# Patient Record
Sex: Female | Born: 1985 | Race: White | Hispanic: No | Smoking: Never smoker
Health system: Southern US, Community
[De-identification: ages and names within clinical notes are randomized; demographics above are authoritative.]

## PROBLEM LIST (undated history)

## (undated) DIAGNOSIS — F32A Depression, unspecified: Secondary | ICD-10-CM

## (undated) DIAGNOSIS — E785 Hyperlipidemia, unspecified: Secondary | ICD-10-CM

## (undated) DIAGNOSIS — F419 Anxiety disorder, unspecified: Secondary | ICD-10-CM

## (undated) DIAGNOSIS — J45909 Unspecified asthma, uncomplicated: Secondary | ICD-10-CM

## (undated) HISTORY — DX: Unspecified asthma, uncomplicated: J45.909

## (undated) HISTORY — DX: Anxiety disorder, unspecified: F41.9

## (undated) HISTORY — DX: Depression, unspecified: F32.A

## (undated) HISTORY — DX: Hyperlipidemia, unspecified: E78.5

## (undated) HISTORY — PX: OPEN REPAIR PERIARTICULAR FRACTURE / DISLOCATION ELBOW: SUR901

## (undated) HISTORY — PX: TONSILLECTOMY: SUR1361

---

## 2011-11-18 DIAGNOSIS — IMO0002 Reserved for concepts with insufficient information to code with codable children: Secondary | ICD-10-CM | POA: Insufficient documentation

## 2016-07-19 DIAGNOSIS — J309 Allergic rhinitis, unspecified: Secondary | ICD-10-CM | POA: Insufficient documentation

## 2016-07-19 DIAGNOSIS — L74510 Primary focal hyperhidrosis, axilla: Secondary | ICD-10-CM | POA: Insufficient documentation

## 2016-07-19 DIAGNOSIS — Z6740 Type O blood, Rh positive: Secondary | ICD-10-CM | POA: Insufficient documentation

## 2016-07-19 DIAGNOSIS — Z3041 Encounter for surveillance of contraceptive pills: Secondary | ICD-10-CM | POA: Insufficient documentation

## 2016-08-23 DIAGNOSIS — Z9289 Personal history of other medical treatment: Secondary | ICD-10-CM | POA: Insufficient documentation

## 2016-08-24 DIAGNOSIS — M722 Plantar fascial fibromatosis: Secondary | ICD-10-CM | POA: Insufficient documentation

## 2016-08-24 DIAGNOSIS — Z0000 Encounter for general adult medical examination without abnormal findings: Secondary | ICD-10-CM | POA: Insufficient documentation

## 2016-09-07 DIAGNOSIS — M79672 Pain in left foot: Secondary | ICD-10-CM | POA: Insufficient documentation

## 2019-08-03 LAB — HM PAP SMEAR: HM Pap smear: NORMAL

## 2020-09-24 LAB — CBC AND DIFFERENTIAL
HCT: 35 — AB (ref 36–46)
Hemoglobin: 11.6 — AB (ref 12.0–16.0)
Platelets: 421 — AB (ref 150–399)
WBC: 9.3

## 2020-09-24 LAB — CBC: RBC: 4.4 (ref 3.87–5.11)

## 2020-11-20 ENCOUNTER — Encounter: Payer: Self-pay | Admitting: Medical-Surgical

## 2020-11-25 NOTE — Telephone Encounter (Signed)
Unfortunately we can't email forms like that because we would need to see it filled out and a witness signature has to be on the form as well. She can ask her current office for a medical record release form there to fill out, and we can give her our fax number to fax it from there. AM

## 2020-12-02 ENCOUNTER — Encounter: Payer: Self-pay | Admitting: Medical-Surgical

## 2020-12-02 ENCOUNTER — Ambulatory Visit: Payer: 59 | Admitting: Medical-Surgical

## 2020-12-02 ENCOUNTER — Other Ambulatory Visit: Payer: Self-pay

## 2020-12-02 VITALS — BP 107/70 | HR 86 | Temp 98.2°F | Ht 67.5 in | Wt 284.5 lb

## 2020-12-02 DIAGNOSIS — Z789 Other specified health status: Secondary | ICD-10-CM

## 2020-12-02 DIAGNOSIS — J302 Other seasonal allergic rhinitis: Secondary | ICD-10-CM

## 2020-12-02 DIAGNOSIS — J45909 Unspecified asthma, uncomplicated: Secondary | ICD-10-CM

## 2020-12-02 DIAGNOSIS — K219 Gastro-esophageal reflux disease without esophagitis: Secondary | ICD-10-CM

## 2020-12-02 DIAGNOSIS — Z114 Encounter for screening for human immunodeficiency virus [HIV]: Secondary | ICD-10-CM

## 2020-12-02 DIAGNOSIS — F410 Panic disorder [episodic paroxysmal anxiety] without agoraphobia: Secondary | ICD-10-CM

## 2020-12-02 DIAGNOSIS — Z87898 Personal history of other specified conditions: Secondary | ICD-10-CM

## 2020-12-02 DIAGNOSIS — Z8619 Personal history of other infectious and parasitic diseases: Secondary | ICD-10-CM

## 2020-12-02 DIAGNOSIS — Z7689 Persons encountering health services in other specified circumstances: Secondary | ICD-10-CM

## 2020-12-02 DIAGNOSIS — D508 Other iron deficiency anemias: Secondary | ICD-10-CM

## 2020-12-02 DIAGNOSIS — J329 Chronic sinusitis, unspecified: Secondary | ICD-10-CM | POA: Diagnosis not present

## 2020-12-02 DIAGNOSIS — Z1159 Encounter for screening for other viral diseases: Secondary | ICD-10-CM

## 2020-12-02 DIAGNOSIS — E559 Vitamin D deficiency, unspecified: Secondary | ICD-10-CM

## 2020-12-02 DIAGNOSIS — E782 Mixed hyperlipidemia: Secondary | ICD-10-CM

## 2020-12-02 MED ORDER — OZEMPIC (1 MG/DOSE) 4 MG/3ML ~~LOC~~ SOPN: 1.0000 mg | PEN_INJECTOR | SUBCUTANEOUS | 1 refills | Status: DC

## 2020-12-02 NOTE — Progress Notes (Addendum)
New Patient Office Visit  Subjective:  Patient ID: Shawna Barnett, female    DOB: 1985/12/11  Age: 35 y.o. MRN: [id number]  CC:  Chief Complaint  Patient presents with  . Establish Care    HPI Shawna Barnett presents to establish care.   Asthma/seasonal allergies-Long history of severe allergies.  She is followed by [employer] nose and throat as well as an allergist.  She is currently undergoing immunotherapy and they are switching her vials to accommodate her move from [place] to [place].  She takes Singulair, Claritin, and Allegra daily.  Has an EpiPen on hand for use due to several severe allergies/risk of anaphylaxis.  Chronic sinusitis-recently completed 4 weeks of antibiotics due to a severe sinus infection that was resistant to the original treatment.  Followed by [facility].  Hyperlipidemia-she is trying to avoid starting a statin medication.  She realizes her cholesterol is likely partly genetic.  She does have a bit of a finicky diet preference.  Avoids eggs as well as red meat.  And deficiency anemia-notes that she is mildly anemic related to iron deficiency.  Taking an oral iron supplement daily.  Vitamin D deficiency-taking oral vitamin D supplements daily.  Her last check was normal.  Birth control-taking Junel, tolerating well without side effects.  She has been on this for about 15 years.  Did have a recent menstrual cycle that lasted for 14 days but feels this may be related to stress as well as long-term antibiotics.  Planning to attempt pregnancy once she has lost more weight.  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  Has Valtrex to use as needed but often topical use of Abreva will take care of it if started early enough.  GERD-taking omeprazole 20 mg daily as prescribed, tolerating well without side effects.  Symptoms well managed. Anxiety/panic attacks-used to be treated with Lexapro but was able to  successfully wean off this.  She does do counseling regularly which has been very helpful to develop coping skills.  Has Xanax 0.25 mg as needed ordered and takes only half a pill for severe panic attacks.  Notes that she may have had a total of 20 doses in the last 4 years.  Morbid obesity-has been taking Ozempic 0.5 mg weekly.  Lost approximately 30 pounds in the first 2 months on that but since then, has not lost any more.  Was previously prediabetic but since starting Ozempic, A1c came back at 5.3%.  She does walk for exercise but has pain in her legs and feet so can be limited at times.  Would like to increase Ozempic to 1 mg weekly if possible.  History reviewed. No pertinent past medical history.  History reviewed. No pertinent surgical history.  History reviewed. No pertinent family history.  Social History   Socioeconomic History  . Marital status: Married    Spouse name: Not on file  . Number of children: Not on file  . Years of education: Not on file  . Highest education level: Not on file  Occupational History  . Not on file  Tobacco Use  . Smoking status: Never Smoker  . Smokeless tobacco: Never Used  Substance and Sexual Activity  . Alcohol use: Yes    Comment: RARELY  . Drug use: Never  . Sexual activity: Yes    Partners: Male    Birth control/protection: Pill  Other Topics Concern  . Not on file  Social History Narrative  .  Not on file   Social Determinants of Health   Financial Resource Strain: Not on file  Food Insecurity: Not on file  Transportation Needs: Not on file  Physical Activity: Not on file  Stress: Not on file  Social Connections: Not on file  Intimate Partner Violence: Not on file    ROS Review of Systems  Constitutional: Negative for chills, fatigue, fever and unexpected weight change.  HENT: Positive for congestion and sinus pressure. Negative for sore throat.   Respiratory: Positive for shortness of breath (on exertion). Negative for  cough, chest tightness and wheezing.   Cardiovascular: Negative for chest pain, palpitations and leg swelling.  Gastrointestinal: Negative for abdominal pain, constipation, diarrhea, nausea and vomiting.  Endocrine: Negative for cold intolerance and heat intolerance.  Genitourinary: Negative for dysuria, frequency, urgency, vaginal bleeding and vaginal discharge.  Skin: Negative for rash and wound.  Neurological: Negative for dizziness, light-headedness and headaches.  Hematological: Does not bruise/bleed easily.  Psychiatric/Behavioral: Negative for dysphoric mood, self-injury, sleep disturbance and suicidal ideas. The patient is nervous/anxious.     Objective:   Today's Vitals: BP 107/70   Pulse 86   Temp 98.2 F (36.8 C)   Ht 5' 7.5" (1.715 m)   Wt 284 lb 8 oz (129 kg)   LMP 11/18/2020   SpO2 98%   BMI 43.90 kg/m   Physical Exam Vitals reviewed.  Constitutional:      General: She is not in acute distress.    Appearance: Normal appearance. She is obese. She is not ill-appearing.  HENT:     Head: Normocephalic and atraumatic.  Cardiovascular:     Rate and Rhythm: Normal rate and regular rhythm.     Pulses: Normal pulses.     Heart sounds: Normal heart sounds. No murmur heard. No friction rub. No gallop.   Pulmonary:     Effort: Pulmonary effort is normal. No respiratory distress.     Breath sounds: Normal breath sounds. No wheezing.  Skin:    General: Skin is warm and dry.  Neurological:     Mental Status: She is alert and oriented to person, place, and time.  Psychiatric:        Mood and Affect: Mood normal.        Behavior: Behavior normal.        Thought Content: Thought content normal.        Judgment: Judgment normal.     Assessment & Plan:   1. Encounter to establish care Reviewed available information and discussed healthcare concerns with patient.  She is very active in her health care which is quite refreshing.  Will be due for follow-up on lab work in  about 3 months.  2. Uncomplicated asthma, unspecified asthma severity, unspecified whether persistent Continue albuterol and Qvar inhalers as prescribed.  Continue treatments for seasonal allergies to prevent exacerbation of asthma.  3. Seasonal allergies Continue Singulair, Claritin, and Allegra as prescribed.  Further management by her allergist.  4. Chronic sinusitis, unspecified location Managed by ENT.  5. History of prediabetes Last A1c of 5.3% looks amazing.  Continue weight loss efforts and dietary modifications.  6. Morbid obesity (HCC) Increasing Ozempic to 1 mg weekly.  Continue exercise and dietary modifications.  7. Mixed hyperlipidemia Continue to monitor diet for areas of improvement.  If lipids are still elevated next check, discuss options and alternatives for treating hyperlipidemia outside of statins.  8. Iron deficiency anemia secondary to inadequate dietary iron intake Continue oral iron supplements  9. Vitamin D deficiency Continue vitamin D supplements.  10. Uses birth control Continue Junel as prescribed  11. Hx of cold sores Continue Abreva and Valtrex as needed  12. Gastroesophageal reflux disease without esophagitis Continue omeprazole 20 mg daily as prescribed.  13. Panic attacks Okay to continue Xanax for panic attacks with extreme sparing use.  Continue counseling.  Outpatient Encounter Medications as of 12/02/2020  Medication Sig  . albuterol (VENTOLIN HFA) 108 (90 Base) MCG/ACT inhaler Inhale 2 puffs into the lungs every 4 (four) hours as needed.  . ALPRAZolam (XANAX) 0.25 MG tablet Take 0.25 mg by mouth daily as needed.  . beclomethasone (QVAR) 40 MCG/ACT inhaler Inhale into the lungs 2 (two) times daily as needed.  . cholecalciferol (VITAMIN D3) 25 MCG (1000 UNIT) tablet Take 1,000 Units by mouth daily.  Marland Kitchen EPINEPHrine 0.3 mg/0.3 mL IJ SOAJ injection Inject 0.3 mg into the muscle as needed for anaphylaxis.  . Ferrous Sulfate (IRON) 28 MG  TABS Take by mouth daily.  . fexofenadine (ALLEGRA) 180 MG tablet Take 180 mg by mouth daily.  Marland Kitchen ipratropium (ATROVENT) 0.06 % nasal spray Place 2 sprays into both nostrils 2 (two) times daily.  Colleen Can FE 1/20 1-20 MG-MCG tablet Take 1 tablet by mouth daily.  Marland Kitchen loratadine (CLARITIN) 10 MG tablet Take 10 mg by mouth daily.  . montelukast (SINGULAIR) 10 MG tablet Take 1 tablet by mouth daily.  Marland Kitchen omeprazole (PRILOSEC) 20 MG capsule Take 20 mg by mouth daily.  . Probiotic Product (PROBIOTIC PO) Take by mouth daily.  . Semaglutide, 1 MG/DOSE, (OZEMPIC, 1 MG/DOSE,) 4 MG/3ML SOPN Inject 1 mg into the skin once a week.  . valACYclovir (VALTREX) 1000 MG tablet Take 1,000 mg by mouth daily as needed. For cold sores  . vitamin B-12 (CYANOCOBALAMIN) 1000 MCG tablet Take 1,000 mcg by mouth daily.  . [DISCONTINUED] OZEMPIC, 0.25 OR 0.5 MG/DOSE, 2 MG/1.5ML SOPN 0.5 mg once a week.   No facility-administered encounter medications on file as of 12/02/2020.   Follow-up: Return in about 3 months (around 03/04/2021) for chronic disease follow up.   Thayer Ohm, DNP, APRN, FNP-BC [facility] and Sports Medicine

## 2020-12-15 ENCOUNTER — Encounter: Payer: Self-pay | Admitting: Medical-Surgical

## 2020-12-17 ENCOUNTER — Encounter: Payer: Self-pay | Admitting: Medical-Surgical

## 2020-12-25 ENCOUNTER — Encounter: Payer: Self-pay | Admitting: Medical-Surgical

## 2020-12-25 DIAGNOSIS — R7303 Prediabetes: Secondary | ICD-10-CM | POA: Insufficient documentation

## 2020-12-25 DIAGNOSIS — K219 Gastro-esophageal reflux disease without esophagitis: Secondary | ICD-10-CM | POA: Insufficient documentation

## 2020-12-25 DIAGNOSIS — R7989 Other specified abnormal findings of blood chemistry: Secondary | ICD-10-CM | POA: Insufficient documentation

## 2020-12-25 DIAGNOSIS — E785 Hyperlipidemia, unspecified: Secondary | ICD-10-CM | POA: Insufficient documentation

## 2020-12-25 DIAGNOSIS — Z862 Personal history of diseases of the blood and blood-forming organs and certain disorders involving the immune mechanism: Secondary | ICD-10-CM | POA: Insufficient documentation

## 2020-12-25 DIAGNOSIS — J4530 Mild persistent asthma, uncomplicated: Secondary | ICD-10-CM | POA: Insufficient documentation

## 2020-12-25 DIAGNOSIS — F419 Anxiety disorder, unspecified: Secondary | ICD-10-CM | POA: Insufficient documentation

## 2020-12-25 DIAGNOSIS — E8881 Metabolic syndrome: Secondary | ICD-10-CM | POA: Insufficient documentation

## 2021-01-06 ENCOUNTER — Encounter: Payer: Self-pay | Admitting: Medical-Surgical

## 2021-01-06 ENCOUNTER — Telehealth (INDEPENDENT_AMBULATORY_CARE_PROVIDER_SITE_OTHER): Payer: 59 | Admitting: Medical-Surgical

## 2021-01-06 DIAGNOSIS — F419 Anxiety disorder, unspecified: Secondary | ICD-10-CM | POA: Diagnosis not present

## 2021-01-06 MED ORDER — ESCITALOPRAM OXALATE 5 MG PO TABS: 5.0000 mg | ORAL_TABLET | Freq: Every day | ORAL | 1 refills | Status: DC

## 2021-01-06 NOTE — Progress Notes (Signed)
Virtual Visit via Video Note  I connected with Shawna Barnett on 01/06/21 at  3:40 PM EDT by a video enabled telemedicine application and verified that I am speaking with the correct person using two identifiers.   I discussed the limitations of evaluation and management by telemedicine and the availability of in person appointments. The patient expressed understanding and agreed to proceed.  Patient location: home Provider locations: office  Subjective:    CC: Mood follow-up  HPI: Pleasant 35 year old female presenting today via MyChart video visit for mood follow-up.  Notes that she started Lexapro 5 mg daily approximately 4 weeks ago.  She is doing very well on this medication and has had no side effects.  Notes that her job and a recent move had caused some significant anxiety which prompted her to restart the medication.  By day 2, she was feeling normal again.  Previously treated with 10 mg dose but felt this left her emotions feeling to numb.  She is currently doing counseling every 2 weeks which is helpful.  Has been able to resolve some of her stress, especially now that she is completed her move.  Since starting Lexapro, has not had to use any doses of Xanax.  Denies SI/HI.  Of note, she has been using Ozempic 1 mg subcu weekly and doing well on the medication.  She has no side effects to mention and reports that she is losing approximately 1 pound per week since starting the medication.   Past medical history, Surgical history, Family history not pertinant except as noted below, Social history, Allergies, and medications have been entered into the medical record, reviewed, and corrections made.   Review of Systems: See HPI for pertinent positives and negatives.   Objective:    General: Speaking clearly in complete sentences without any shortness of breath.  Alert and oriented x3.  Normal judgment. No apparent acute distress.  Impression and Recommendations:    1.  Anxiety Continue Lexapro 5 mg daily.  New prescription called in to the pharmacy per patient request.  Continue counseling every 2 weeks as instructed.  2. Morbid obesity (HCC) Continue Ozempic 1 mg weekly.  Continue dietary and lifestyle modifications for weight loss.  I discussed the assessment and treatment plan with the patient. The patient was provided an opportunity to ask questions and all were answered. The patient agreed with the plan and demonstrated an understanding of the instructions.   The patient was advised to call back or seek an in-person evaluation if the symptoms worsen or if the condition fails to improve as anticipated.  20 minutes of non-face-to-face time was provided during this encounter.  Return in about 6 months (around 07/08/2021) for mood follow up.  Thayer Ohm, DNP, APRN, FNP-BC [facility] and Sports Medicine

## 2021-01-13 ENCOUNTER — Encounter: Payer: Self-pay | Admitting: Medical-Surgical

## 2021-03-04 ENCOUNTER — Ambulatory Visit: Payer: 59 | Admitting: Medical-Surgical

## 2021-03-06 ENCOUNTER — Encounter: Payer: Self-pay | Admitting: Medical-Surgical

## 2021-03-06 ENCOUNTER — Telehealth: Payer: Self-pay | Admitting: Medical-Surgical

## 2021-03-06 ENCOUNTER — Other Ambulatory Visit: Payer: Self-pay

## 2021-03-06 ENCOUNTER — Ambulatory Visit: Payer: 59 | Admitting: Medical-Surgical

## 2021-03-06 VITALS — BP 106/72 | HR 83 | Resp 20 | Wt 270.0 lb

## 2021-03-06 DIAGNOSIS — J302 Other seasonal allergic rhinitis: Secondary | ICD-10-CM | POA: Diagnosis not present

## 2021-03-06 DIAGNOSIS — D508 Other iron deficiency anemias: Secondary | ICD-10-CM

## 2021-03-06 DIAGNOSIS — E8881 Metabolic syndrome: Secondary | ICD-10-CM

## 2021-03-06 DIAGNOSIS — E782 Mixed hyperlipidemia: Secondary | ICD-10-CM

## 2021-03-06 DIAGNOSIS — F419 Anxiety disorder, unspecified: Secondary | ICD-10-CM | POA: Diagnosis not present

## 2021-03-06 DIAGNOSIS — E538 Deficiency of other specified B group vitamins: Secondary | ICD-10-CM

## 2021-03-06 DIAGNOSIS — E559 Vitamin D deficiency, unspecified: Secondary | ICD-10-CM

## 2021-03-06 DIAGNOSIS — R7303 Prediabetes: Secondary | ICD-10-CM

## 2021-03-06 DIAGNOSIS — J45909 Unspecified asthma, uncomplicated: Secondary | ICD-10-CM

## 2021-03-06 DIAGNOSIS — F410 Panic disorder [episodic paroxysmal anxiety] without agoraphobia: Secondary | ICD-10-CM

## 2021-03-06 NOTE — Telephone Encounter (Signed)
A1c added. Req #: [id number]

## 2021-03-06 NOTE — Telephone Encounter (Signed)
Please add hemoglobin A1c to blood collected today.  Thayer Ohm, DNP, APRN, FNP-BC [facility] and Sports Medicine

## 2021-03-06 NOTE — Progress Notes (Signed)
  HPI with pertinent ROS:   CC: general follow up  HPI: Very pleasant 35 year old female presenting today for general follow-up on:  Anxiety/mood-has been doing very well taking Lexapro 5 mg daily.  Notes that she does not have the emotional numbing as she did on the higher dose.  Feels that her symptoms are very well controlled.  Has not had to use Xanax since starting the low-dose Lexapro.  Asthma-she is using her albuterol inhaler as well as her Qvar twice daily regularly over the last 2 weeks and seems to be managing well on that regimen.  Also taking Claritin, Allegra, and Singulair.  She does have significant allergies and is currently receiving allergy shots.  Being followed by [facility].  Metabolic syndrome-has been using Ozempic 1 mg weekly, tolerating well without side effects.  Over the last 3 months, has had approximately 13 pound weight loss.  Notes that she is doing very well and quite happy with her treatment.  I reviewed the past medical history, family history, social history, surgical history, and allergies today and no changes were needed.  Please see the problem list section below in epic for further details.   Physical exam:   General: Well Developed, well nourished, and in no acute distress.  Neuro: Alert and oriented x3.  HEENT: Normocephalic, atraumatic. Skin: Warm and dry. Cardiac: Regular rate and rhythm, no murmurs rubs or gallops, no lower extremity edema.  Respiratory: Clear to auscultation bilaterally. Not using accessory muscles, speaking in full sentences.  Impression and Recommendations:    1. Anxiety Continue Lexapro 5mg  daily as prescribed.   2. Morbid obesity (HCC) Continue Ozempic 1mg  daily.  Continue diet and exercise efforts.  Suggest increasing resistance/strength training to help build muscle.  3. Seasonal allergies 4. Uncomplicated asthma, unspecified asthma severity, unspecified whether persistent Continue inhalers and oral agents as  prescribed.  Follow-up with [facility] as instructed.  5. Mixed hyperlipidemia Checking lipid panel today. - Lipid panel  6. Vitamin D deficiency Checking vitamin D today. - VITAMIN D 25 Hydroxy (Vit-D Deficiency, Fractures)  7. Iron deficiency anemia secondary to inadequate dietary iron intake Checking CBC with differential and iron panel today. - CBC with Differential/Platelet - Fe+TIBC+Fer  8. Panic attacks Doing quite well with the Lexapro 5 mg daily.  No change in management at this point.  9. Prediabetes Continue Ozempic as prescribed.  Checking hemoglobin A1c.  10. Metabolic syndrome Checking CMP and TSH. - COMPLETE METABOLIC PANEL WITH GFR - TSH  11. B12 deficiency Checking vitamin B12. - Vitamin B12  Return in about 6 months (around 09/06/2021) for chronic disease follow up. ___________________________________________ , DNP, APRN, FNP-BC Primary Care and Sports Medicine [facility]

## 2021-03-11 LAB — COMPLETE METABOLIC PANEL WITH GFR
AG Ratio: 1.4 (calc) (ref 1.0–2.5)
ALT: 8 U/L (ref 6–29)
AST: 11 U/L (ref 10–30)
Albumin: 4.2 g/dL (ref 3.6–5.1)
Alkaline phosphatase (APISO): 74 U/L (ref 31–125)
BUN: 13 mg/dL (ref 7–25)
CO2: 26 mmol/L (ref 20–32)
Calcium: 9.4 mg/dL (ref 8.6–10.2)
Chloride: 104 mmol/L (ref 98–110)
Creat: 0.97 mg/dL (ref 0.50–0.97)
Globulin: 3 g/dL (calc) (ref 1.9–3.7)
Glucose, Bld: 93 mg/dL (ref 65–99)
Potassium: 4.9 mmol/L (ref 3.5–5.3)
Sodium: 139 mmol/L (ref 135–146)
Total Bilirubin: 0.3 mg/dL (ref 0.2–1.2)
Total Protein: 7.2 g/dL (ref 6.1–8.1)
eGFR: 79 mL/min/{1.73_m2} (ref >=60)

## 2021-03-11 LAB — IRON,TIBC AND FERRITIN PANEL
%SAT: 11 % (calc) — ABNORMAL LOW (ref 16–45)
Ferritin: 13 ng/mL — ABNORMAL LOW (ref 16–154)
Iron: 44 ug/dL (ref 40–190)
TIBC: 398 mcg/dL (calc) (ref 250–450)

## 2021-03-11 LAB — CBC WITH DIFFERENTIAL/PLATELET
Absolute Monocytes: 492 cells/uL (ref 200–950)
Basophils Absolute: 37 cells/uL (ref 0–200)
Basophils Relative: 0.3 %
Eosinophils Absolute: 135 cells/uL (ref 15–500)
Eosinophils Relative: 1.1 %
HCT: 42.5 % (ref 35.0–45.0)
Hemoglobin: 13.7 g/dL (ref 11.7–15.5)
Lymphs Abs: 3014 cells/uL (ref 850–3900)
MCH: 27.3 pg (ref 27.0–33.0)
MCHC: 32.2 g/dL (ref 32.0–36.0)
MCV: 84.7 fL (ref 80.0–100.0)
MPV: 11.9 fL (ref 7.5–12.5)
Monocytes Relative: 4 %
Neutro Abs: 8622 cells/uL — ABNORMAL HIGH (ref 1500–7800)
Neutrophils Relative %: 70.1 %
Platelets: 394 10*3/uL (ref 140–400)
RBC: 5.02 10*6/uL (ref 3.80–5.10)
RDW: 13.5 % (ref 11.0–15.0)
Total Lymphocyte: 24.5 %
WBC: 12.3 10*3/uL — ABNORMAL HIGH (ref 3.8–10.8)

## 2021-03-11 LAB — TEST AUTHORIZATION

## 2021-03-11 LAB — HEMOGLOBIN A1C W/OUT EAG: Hgb A1c MFr Bld: 5.4 % of total Hgb (ref <5.7)

## 2021-03-11 LAB — LIPID PANEL
Cholesterol: 229 mg/dL — ABNORMAL HIGH (ref <200)
HDL: 52 mg/dL (ref >=50)
LDL Cholesterol (Calc): 145 mg/dL (calc) — ABNORMAL HIGH
Non-HDL Cholesterol (Calc): 177 mg/dL (calc) — ABNORMAL HIGH (ref <130)
Total CHOL/HDL Ratio: 4.4 (calc) (ref <5.0)
Triglycerides: 180 mg/dL — ABNORMAL HIGH (ref <150)

## 2021-03-11 LAB — TSH: TSH: 0.55 mIU/L

## 2021-03-11 LAB — VITAMIN D 25 HYDROXY (VIT D DEFICIENCY, FRACTURES): Vit D, 25-Hydroxy: 64 ng/mL (ref 30–100)

## 2021-03-11 LAB — VITAMIN B12: Vitamin B-12: 453 pg/mL (ref 200–1100)

## 2021-05-28 ENCOUNTER — Encounter: Payer: Self-pay | Admitting: Medical-Surgical

## 2021-06-01 MED ORDER — SEMAGLUTIDE-WEIGHT MANAGEMENT 1.7 MG/0.75ML ~~LOC~~ SOAJ: 1.7000 mg | SUBCUTANEOUS | 0 refills | Status: DC

## 2021-06-26 ENCOUNTER — Other Ambulatory Visit: Payer: Self-pay | Admitting: Medical-Surgical

## 2021-06-30 ENCOUNTER — Other Ambulatory Visit: Payer: Self-pay | Admitting: Medical-Surgical

## 2021-07-02 ENCOUNTER — Encounter: Payer: Self-pay | Admitting: Medical-Surgical

## 2021-07-03 MED ORDER — QVAR REDIHALER 40 MCG/ACT IN AERB: 1.0000 | INHALATION_SPRAY | Freq: Two times a day (BID) | RESPIRATORY_TRACT | 5 refills | Status: DC | PRN

## 2021-07-24 ENCOUNTER — Other Ambulatory Visit: Payer: Self-pay | Admitting: Medical-Surgical

## 2021-08-04 ENCOUNTER — Ambulatory Visit (INDEPENDENT_AMBULATORY_CARE_PROVIDER_SITE_OTHER): Payer: 59

## 2021-08-04 ENCOUNTER — Other Ambulatory Visit: Payer: Self-pay | Admitting: General Practice

## 2021-08-04 ENCOUNTER — Other Ambulatory Visit: Payer: Self-pay

## 2021-08-04 DIAGNOSIS — R52 Pain, unspecified: Secondary | ICD-10-CM

## 2021-08-27 ENCOUNTER — Other Ambulatory Visit: Payer: Self-pay | Admitting: Medical-Surgical

## 2021-09-03 ENCOUNTER — Other Ambulatory Visit: Payer: Self-pay | Admitting: Medical-Surgical

## 2021-09-03 NOTE — Telephone Encounter (Signed)
Please see pharmacy comments with alternatives.

## 2021-09-22 ENCOUNTER — Other Ambulatory Visit: Payer: Self-pay | Admitting: Medical-Surgical

## 2021-09-27 NOTE — Progress Notes (Signed)
HPI with pertinent ROS:   CC: 6 month follow up  HPI: Pleasant 36 year old female presenting today for the following:  Asthma-doing well overall.  She uses budesonide inhaler and albuterol for second the morning.  Also uses albuterol for exercise.  Feels this keeps her symptoms well controlled.  GERD-taking omeprazole 20 mg daily and notes a rebound of symptoms if she forgets her dose.  Occasionally uses over-the-counter famotidine when reflux is bad.  Has not tried coming off of omeprazole but is willing to try famotidine twice daily and a slow taper of her PPI to see if this is tolerable.  Anxiety-taking Lexapro 5 mg daily, tolerating well without side effects.  Feels this is working well to keep her mood stable.  She does have significant work stress but sees a Veterinary surgeon around 2 times monthly.  Occasionally, she will see them more frequently.  Does have Xanax to use as needed however she has not had to use this in approximately 6 months.  Denies SI/HI.  Prediabetes-using Wegovy 1.7 mg weekly.  Has had a decrease in her appetite overall and is making healthier choices.  HLD-not currently on medication.  Does follow a low-fat diet.  Does not eat meat and is quite picky about her eggs.  Does like cheese but has not been eating much of this lately.  Weight management-using Wegovy as above.  Has lost approximately 31 pounds since May 2022.  Exercising 5 days a week doing treadmill sessions for 45 minutes to 1 hour each time.  Iron deficiency-taking oral iron supplements as directed.  Seasonal allergies-taking Allegra 180 mg and singular 10 mg daily as prescribed, tolerating well without side effects.  Has Atrovent nasal spray to use which does help with her symptoms.  I reviewed the past medical history, family history, social history, surgical history, and allergies today and no changes were needed.  Please see the problem list section below in epic for further details.   Physical exam:    General: Well Developed, well nourished, and in no acute distress.  Neuro: Alert and oriented x3, extra-ocular muscles intact, sensation grossly intact.  HEENT: Normocephalic, atraumatic, pupils equal round reactive to light, neck supple, no masses, no lymphadenopathy, thyroid nonpalpable.  Skin: Warm and dry, no rashes. Cardiac: Regular rate and rhythm, no murmurs rubs or gallops, no lower extremity edema.  Respiratory: Clear to auscultation bilaterally. Not using accessory muscles, speaking in full sentences.  Impression and Recommendations:    1. Mild persistent asthma without complication Stable.  Continue albuterol inhaler as needed and budesonide as prescribed twice daily.  2. Gastroesophageal reflux disease, unspecified whether esophagitis present Discussed using famotidine 20 mg twice daily and working on titrating off of her PPI.  If symptoms do recur or worsen with the titration, okay to continue omeprazole 20 mg daily.  3. Anxiety Stable.  Continue Lexapro 5 mg daily.  Only use Xanax for severe anxiety.  Continue counseling.  4. Prediabetes Checking labs as below. - COMPLETE METABOLIC PANEL WITH GFR - Hemoglobin A1c  5. Mixed hyperlipidemia Checking lipid panel. - Lipid panel  6. Iron deficiency anemia secondary to inadequate dietary iron intake Checking CBC and iron panel - CBC - Fe+TIBC+Fer  7. Encounter for weight management 31 pounds lost since May 2022.  Continue Wegovy 1.7 mg weekly.  Sending in as a 1-month supply to see if this will be covered by insurance.  8. Seasonal allergies Continue Allegra, Singulair, and Atrovent.  9. Encounter for screening for HIV Discussed  screening recommendations.  Patient is agreeable so adding to blood work today. - HIV Antibody (routine testing w rflx)  10. Need for hepatitis C screening test Discussed screening recommendations.  Patient is agreeable so adding to blood work today. - Hepatitis C Antibody  Return in  about 6 months (around 03/28/2022) for chronic disease follow up. ___________________________________________ Thayer Ohm, DNP, APRN, FNP-BC Primary Care and Sports Medicine [facility]

## 2021-09-28 ENCOUNTER — Ambulatory Visit: Payer: 59 | Admitting: Medical-Surgical

## 2021-09-28 ENCOUNTER — Encounter: Payer: Self-pay | Admitting: Medical-Surgical

## 2021-09-28 ENCOUNTER — Other Ambulatory Visit: Payer: Self-pay

## 2021-09-28 VITALS — BP 98/66 | HR 81 | Resp 20 | Ht 67.5 in | Wt 253.0 lb

## 2021-09-28 DIAGNOSIS — J4530 Mild persistent asthma, uncomplicated: Secondary | ICD-10-CM | POA: Diagnosis not present

## 2021-09-28 DIAGNOSIS — Z7689 Persons encountering health services in other specified circumstances: Secondary | ICD-10-CM

## 2021-09-28 DIAGNOSIS — F419 Anxiety disorder, unspecified: Secondary | ICD-10-CM

## 2021-09-28 DIAGNOSIS — K219 Gastro-esophageal reflux disease without esophagitis: Secondary | ICD-10-CM

## 2021-09-28 DIAGNOSIS — R7303 Prediabetes: Secondary | ICD-10-CM | POA: Diagnosis not present

## 2021-09-28 DIAGNOSIS — D508 Other iron deficiency anemias: Secondary | ICD-10-CM

## 2021-09-28 DIAGNOSIS — Z1159 Encounter for screening for other viral diseases: Secondary | ICD-10-CM

## 2021-09-28 DIAGNOSIS — E782 Mixed hyperlipidemia: Secondary | ICD-10-CM

## 2021-09-28 DIAGNOSIS — Z114 Encounter for screening for human immunodeficiency virus [HIV]: Secondary | ICD-10-CM

## 2021-09-28 DIAGNOSIS — J302 Other seasonal allergic rhinitis: Secondary | ICD-10-CM

## 2021-09-28 MED ORDER — WEGOVY 1.7 MG/0.75ML ~~LOC~~ SOAJ: 1.7000 mg | SUBCUTANEOUS | 1 refills | Status: DC

## 2021-10-07 LAB — LIPID PANEL
Cholesterol: 241 mg/dL — ABNORMAL HIGH (ref <200)
HDL: 50 mg/dL (ref >=50)
LDL Cholesterol (Calc): 169 mg/dL (calc) — ABNORMAL HIGH
Non-HDL Cholesterol (Calc): 191 mg/dL (calc) — ABNORMAL HIGH (ref <130)
Total CHOL/HDL Ratio: 4.8 (calc) (ref <5.0)
Triglycerides: 98 mg/dL (ref <150)

## 2021-10-07 LAB — CBC
HCT: 43.8 % (ref 35.0–45.0)
Hemoglobin: 14.1 g/dL (ref 11.7–15.5)
MCH: 27.9 pg (ref 27.0–33.0)
MCHC: 32.2 g/dL (ref 32.0–36.0)
MCV: 86.7 fL (ref 80.0–100.0)
MPV: 11.4 fL (ref 7.5–12.5)
Platelets: 388 10*3/uL (ref 140–400)
RBC: 5.05 10*6/uL (ref 3.80–5.10)
RDW: 12.9 % (ref 11.0–15.0)
WBC: 7.6 10*3/uL (ref 3.8–10.8)

## 2021-10-07 LAB — COMPLETE METABOLIC PANEL WITH GFR
AG Ratio: 1.3 (calc) (ref 1.0–2.5)
ALT: 9 U/L (ref 6–29)
AST: 12 U/L (ref 10–30)
Albumin: 4 g/dL (ref 3.6–5.1)
Alkaline phosphatase (APISO): 81 U/L (ref 31–125)
BUN: 11 mg/dL (ref 7–25)
CO2: 27 mmol/L (ref 20–32)
Calcium: 9.6 mg/dL (ref 8.6–10.2)
Chloride: 102 mmol/L (ref 98–110)
Creat: 0.91 mg/dL (ref 0.50–0.97)
Globulin: 3.1 g/dL (calc) (ref 1.9–3.7)
Glucose, Bld: 86 mg/dL (ref 65–99)
Potassium: 4.6 mmol/L (ref 3.5–5.3)
Sodium: 137 mmol/L (ref 135–146)
Total Bilirubin: 0.4 mg/dL (ref 0.2–1.2)
Total Protein: 7.1 g/dL (ref 6.1–8.1)
eGFR: 85 mL/min/{1.73_m2} (ref >=60)

## 2021-10-07 LAB — HIV ANTIBODY (ROUTINE TESTING W REFLEX): HIV 1&2 Ab, 4th Generation: NONREACTIVE

## 2021-10-07 LAB — IRON,TIBC AND FERRITIN PANEL
%SAT: 19 % (calc) (ref 16–45)
Ferritin: 17 ng/mL (ref 16–154)
Iron: 65 ug/dL (ref 40–190)
TIBC: 343 mcg/dL (calc) (ref 250–450)

## 2021-10-07 LAB — HEMOGLOBIN A1C
Hgb A1c MFr Bld: 5.4 % of total Hgb (ref <5.7)
Mean Plasma Glucose: 108 mg/dL
eAG (mmol/L): 6 mmol/L

## 2021-10-07 LAB — HEPATITIS C ANTIBODY
Hepatitis C Ab: NONREACTIVE
SIGNAL TO CUT-OFF: 0.03 (ref <1.00)

## 2021-10-10 ENCOUNTER — Encounter: Payer: Self-pay | Admitting: Medical-Surgical

## 2021-10-13 ENCOUNTER — Encounter: Payer: Self-pay | Admitting: Medical-Surgical

## 2021-10-13 ENCOUNTER — Other Ambulatory Visit: Payer: Self-pay

## 2021-10-13 ENCOUNTER — Ambulatory Visit: Payer: 59 | Admitting: Sports Medicine

## 2021-10-13 DIAGNOSIS — M47816 Spondylosis without myelopathy or radiculopathy, lumbar region: Secondary | ICD-10-CM | POA: Diagnosis not present

## 2021-10-13 MED ORDER — MELOXICAM 15 MG PO TABS: ORAL_TABLET | ORAL | 3 refills | Status: DC

## 2021-10-13 MED ORDER — WEGOVY 2.4 MG/0.75ML ~~LOC~~ SOAJ: 2.4000 mg | SUBCUTANEOUS | 1 refills | Status: DC

## 2021-10-13 NOTE — Assessment & Plan Note (Signed)
Shawna Barnett is a very pleasant 36 year old female, she is obese but has lost 60 pounds on Wegovy, congratulated. ?Unfortunately she has continued to have axial back pain, right-sided with radiation into the buttock, worse with standing. ?X-rays from several months ago showed lower lumbar facet arthropathy. ?Her exam is for the most part benign. ?I do think her pain is related to her lumbar facet arthritis, we discussed the anatomy, pathophysiology, anthropology, she will proceed with meloxicam, formal physical therapy, will do all of the above for 6 weeks followed by an MRI if not better. ?I have suggested she discuss increasing to the full 2.4 mg Wegovy with her PCP. ?

## 2021-10-13 NOTE — Progress Notes (Signed)
? ? ?  Procedures performed today:   ? ?None. ? ?Independent interpretation of notes and tests performed by another provider:  ? ?None. ? ?Brief History, Exam, Impression, and Recommendations:   ? ?Lumbar spondylosis ?Shawna Barnett is a very pleasant 36 year old female, she is obese but has lost 60 pounds on Wegovy, congratulated. ?Unfortunately she has continued to have axial back pain, right-sided with radiation into the buttock, worse with standing. ?X-rays from several months ago showed lower lumbar facet arthropathy. ?Her exam is for the most part benign. ?I do think her pain is related to her lumbar facet arthritis, we discussed the anatomy, pathophysiology, anthropology, she will proceed with meloxicam, formal physical therapy, will do all of the above for 6 weeks followed by an MRI if not better. ?I have suggested she discuss increasing to the full 2.4 mg Wegovy with her PCP. ? ?Chronic process with exacerbation and pharmacologic intervention ? ?___________________________________________ ?Ihor Austin. Benjamin Stain, M.D., ABFM., CAQSM. ?Primary Care and Sports Medicine ?[facility] ? ?Adjunct Instructor of Family Medicine  ?[employer] ?

## 2021-10-19 ENCOUNTER — Ambulatory Visit: Payer: 59 | Attending: Sports Medicine | Admitting: Physical Therapy

## 2021-10-19 ENCOUNTER — Other Ambulatory Visit: Payer: Self-pay

## 2021-10-19 DIAGNOSIS — M47816 Spondylosis without myelopathy or radiculopathy, lumbar region: Secondary | ICD-10-CM | POA: Insufficient documentation

## 2021-10-19 DIAGNOSIS — M25551 Pain in right hip: Secondary | ICD-10-CM | POA: Insufficient documentation

## 2021-10-19 DIAGNOSIS — M6281 Muscle weakness (generalized): Secondary | ICD-10-CM | POA: Insufficient documentation

## 2021-10-19 DIAGNOSIS — R2689 Other abnormalities of gait and mobility: Secondary | ICD-10-CM | POA: Diagnosis present

## 2021-10-19 DIAGNOSIS — M25651 Stiffness of right hip, not elsewhere classified: Secondary | ICD-10-CM | POA: Diagnosis present

## 2021-10-21 ENCOUNTER — Ambulatory Visit: Payer: 59 | Admitting: Physical Therapy

## 2021-10-21 ENCOUNTER — Other Ambulatory Visit: Payer: Self-pay

## 2021-10-21 DIAGNOSIS — M25551 Pain in right hip: Secondary | ICD-10-CM

## 2021-10-21 DIAGNOSIS — M25651 Stiffness of right hip, not elsewhere classified: Secondary | ICD-10-CM

## 2021-10-21 DIAGNOSIS — M6281 Muscle weakness (generalized): Secondary | ICD-10-CM | POA: Diagnosis not present

## 2021-10-21 DIAGNOSIS — R2689 Other abnormalities of gait and mobility: Secondary | ICD-10-CM

## 2021-10-21 NOTE — Therapy (Signed)
[place] ?[facility] ?[address] [place] [address] ?[place], [place], [address] ?Phone: [phone]   Fax:  [phone] ? ?Physical Therapy Evaluation ? ?Patient Details  ?Name: Shawna Barnett ?MRN: [id number] ?Date of Birth: 10/21/1985 ?Referring Provider (PT): Monica Becton, MD ? ? ?Encounter Date: 10/19/2021 ? ? PT End of Session - 10/21/21 0708   ? ? Visit Number 1   ? Number of Visits 12   ? Date for PT Re-Evaluation 12/02/21   ? Authorization Type Aetna   ? PT Start Time 1345   ? PT Stop Time 1430   ? PT Time Calculation (min) 45 min   ? Activity Tolerance Patient tolerated treatment well   ? Behavior During Therapy [facility] for tasks assessed/performed   ? ?  ?  ? ?  ? ? ?Past Medical History:  ?Diagnosis Date  ? Anxiety   ? Asthma   ? Depression   ? Hyperlipidemia   ? ? ?Past Surgical History:  ?Procedure Laterality Date  ? OPEN REPAIR PERIARTICULAR FRACTURE / DISLOCATION ELBOW    ? TONSILLECTOMY    ? ? ?There were no vitals filed for this visit. ? ? ? Subjective Assessment - 10/21/21 0706   ? ? Subjective Pt reports hip pain that originates from her back pain. Pt states she lost weight, walked, has seen masseuse and chiropractor. It has helped but has not taken care of it all the way. Pt states that meloxicam has been helping. Pt volunteers [place] the [employer] -- has to stand for long periods. Pt reports doing lower body stretches. Has been ongoing for 6-7 months now.   ? Limitations Standing;Walking   ? How long can you sit comfortably? No issues but can feel it after a couple of hours   ? How long can you stand comfortably? ~2 hours before pain becomes stabbing   ? How long can you walk comfortably? ~1 mile can feel discomfort   ? Patient Stated Goals Exercises to decrease pain   ? Currently in Pain? Yes   ? Pain Score 1    at worst 7/10  ? Pain Location Hip   ? Pain Orientation Right   ? Pain Descriptors / Indicators Aching   ? Pain Type Chronic pain   ? Pain Radiating  Towards can sometimes wrap to front of the thigh   ? Pain Onset More than a month ago   ? Pain Frequency Constant   ? Aggravating Factors  prolonged standing and walking   ? Pain Relieving Factors Heat, massage, chiropractor   ? Effect of Pain on Daily Activities Occupation, volunteer work   ? ?  ?  ? ?  ? ? ? ? ? [facility] PT Assessment - 10/21/21 0001   ? ?  ? Assessment  ? Medical Diagnosis M47.816 (ICD-10-CM) - Lumbar spondylosis   ? Referring Provider (PT) Monica Becton, MD   ? Prior Therapy For L elbow   ?  ? Balance Screen  ? Has the patient fallen in the past 6 months No   ?  ? Prior Function  ? Vocation Full time employment   ? Vocation Requirements Sits primarily for work. 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  ? Observation/Other Assessments  ? Focus on Therapeutic Outcomes (FOTO)  67 (Risk adjusted 51); predicted 74   ?  ? Posture/Postural Control  ? Posture/Postural Control Postural limitations   ? Postural Limitations Rounded Shoulders;Right pelvic  obliquity;Anterior pelvic tilt   ?  ? ROM / Strength  ? AROM / PROM / Strength Strength   ?  ? Strength  ? Strength Assessment Site Hip;Knee   ? Right/Left Hip Right;Left   ? Right Hip Flexion 4+/5   ? Right Hip Extension 4-/5   ? Right Hip External Rotation  4-/5   ? Right Hip Internal Rotation 4-/5   ? Right Hip ABduction 3+/5   ? Right Hip ADduction 3+/5   ? Left Hip Flexion 4+/5   ? Left Hip Extension 4-/5   ? Left Hip External Rotation 4/5   ? Left Hip Internal Rotation 4/5   ? Left Hip ABduction 3+/5   ? Left Hip ADduction 4/5   ? Right/Left Knee Right;Left   ? Right Knee Flexion 4/5   ? Right Knee Extension 4+/5   ? Left Knee Flexion 4+/5   ? Left Knee Extension 4+/5   ?  ? Palpation  ? Spinal mobility spring testing WFL   ? Palpation comment TTP R upper and lateral glute   ?  ? Special Tests  ?  Special Tests Lumbar   ? Lumbar Tests FABER test;Straight Leg Raise   ?  ? FABER test  ? findings Negative   ? Comment  feels stretch in hip bilat   ?  ? Straight Leg Raise  ? Findings Negative   ? Comment R  hamstring tighter than L   ? ?  ?  ? ?  ? ? ? ? ? ? ? ? ? ? ? ? ? ?Objective measurements completed on examination: See above findings.  ? ? ? ? ? ? ? ? ? ? ? ? ? ? PT Education - 10/21/21 0707   ? ? Education Details Exam findings, initial HEP, POC   ? Person(s) Educated Patient   ? Methods Explanation;Demonstration;Tactile cues;Verbal cues;Handout   ? Comprehension Verbalized understanding;Returned demonstration;Verbal cues required;Tactile cues required   ? ?  ?  ? ?  ? ? ? ? ? ? PT Long Term Goals - 10/21/21 0716   ? ?  ? PT LONG TERM GOAL #1  ? Title Pt will be independent with her HEP and progressions   ? Time 6   ? Period Weeks   ? Status New   ? Target Date 12/02/21   ?  ? PT LONG TERM GOAL #2  ? Title Pt will demo increased bilat hip strength to at least 4+/5   ? Time 6   ? Period Weeks   ? Status New   ? Target Date 12/02/21   ?  ? PT LONG TERM GOAL #3  ? Title Pt will have R = L hamstring flexibility to demo decreased muscle imbalance   ? Time 6   ? Period Weeks   ? Status New   ? Target Date 12/02/21   ?  ? PT LONG TERM GOAL #4  ? 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 Time 6   ? Period Weeks   ? Status New   ? Target Date 12/02/21   ?  ? PT LONG TERM GOAL #5  ? Title Pt will have improved FOTO score to 74   ? Time 6   ? Period Weeks   ? Status New   ? Target Date 12/02/21   ? ?  ?  ? ?  ? ? ? ? ? ? ? ? ?  Plan - 10/21/21 0708   ? ? Clinical Impression Statement Ms. Swazil[place]Jordan Giammarco is a 36 y/o F presenting to OPPT due to right hip pain believed to originate from her back. PMH significant for weight loss within the past year. On assessment, pt demos R>L hip tightness, core and bilat hip weakness, with trigger points along glutes. Initiated stretching and strengthening exercises to correct pt's muscle imbalances. Pt would benefit from therapy to address these issues  for improved mobility with less pain.   ? Personal Factors and Comorbidities Age;Fitness;Time since onset of injury/illness/exacerbation;Profession   ? Examination-Activity Limitations Locomotion Level;Sit;Squat;Stand;Transfers   ? Examination-Participation Restrictions Meal Prep;Cleaning;Community Activity;Occupation   ? Stability/Clinical Decision Making Stable/Uncomplicated   ? Clinical Decision Making Moderate   ? Rehab Potential Good   ? PT Frequency 2x / week   1-2x pending on pt's schedule  ? PT Duration 6 weeks   ? PT Treatment/Interventions ADLs/Self Care Home Management;Aquatic Therapy;Cryotherapy;Electrical Stimulation;Iontophoresis 4mg /ml Dexamethasone;Moist Heat;Traction;Gait training;Stair training;Functional mobility training;Therapeutic exercise;Therapeutic activities;Neuromuscular re-education;Balance training;Manual techniques;Patient/family education;Passive range of motion;Dry needling;Taping   ? PT Next Visit Plan Assess response to initial HEP. Continue to stretch and strengthen hip and core.   ? PT Home Exercise Plan Access Code [email or web address]   ? Consulted and Agree with Plan of Care Patient   ? ?  ?  ? ?  ? ? ?Patient will benefit from skilled therapeutic intervention in order to improve the following deficits and impairments:  Decreased range of motion, Difficulty walking, Increased fascial restricitons, Increased muscle spasms, Pain, Improper body mechanics, Decreased mobility, Decreased strength, Postural dysfunction, Impaired flexibility, Hypomobility ? ?Visit Diagnosis: ?Muscle weakness (generalized) ? ?Other abnormalities of gait and mobility ? ?Pain in right hip ? ?Stiffness of right hip, not elsewhere classified ? ? ? ? ?Problem List ?Patient Active Problem List  ? Diagnosis Date Noted  ? Lumbar spondylosis 10/13/2021  ? Anxiety 12/25/2020  ? GERD (gastroesophageal reflux disease) 12/25/2020  ? Hyperlipidemia 12/25/2020  ? History of iron deficiency anemia 12/25/2020  ? Low vitamin D level  12/25/2020  ? Metabolic syndrome 12/25/2020  ? Mild persistent asthma 12/25/2020  ? Morbid obesity (HCC) 12/25/2020  ? Prediabetes 12/25/2020  ? ? ?Pesach Frisch April Dell PontoMa L Afomia Blackley, PT, DPT ?10/21/2021, 7:19 AM

## 2021-10-21 NOTE — Therapy (Signed)
[place] ?[facility] ?[address] [place] [address] ?[place], [place], [address] ?Phone: [phone]   Fax:  [phone] ? ?Physical Therapy Treatment ? ?Patient Details  ?Name: Shawna Barnett ?MRN: [id number] ?Date of Birth: 1986/03/15 ?Referring Provider (PT): Monica Becton, MD ? ? ?Encounter Date: 10/21/2021 ? ? PT End of Session - 10/21/21 1619   ? ? Visit Number 2   ? Number of Visits 12   ? Date for PT Re-Evaluation 12/02/21   ? Authorization Type Aetna   ? PT Start Time 1515   ? PT Stop Time 1600   ? PT Time Calculation (min) 45 min   ? Activity Tolerance Patient tolerated treatment well   ? Behavior During Therapy [facility] for tasks assessed/performed   ? ?  ?  ? ?  ? ? ?Past Medical History:  ?Diagnosis Date  ? Anxiety   ? Asthma   ? Depression   ? Hyperlipidemia   ? ? ?Past Surgical History:  ?Procedure Laterality Date  ? OPEN REPAIR PERIARTICULAR FRACTURE / DISLOCATION ELBOW    ? TONSILLECTOMY    ? ? ?There were no vitals filed for this visit. ? ? Subjective Assessment - 10/21/21 1519   ? ? Subjective Pt states that she has performed HEP and it has been going well   ? Patient Stated Goals Exercises to decrease pain   ? Currently in Pain? No/denies   ? ?  ?  ? ?  ? ? ? ? ? ? ? ? ? ? ? ? ? ? ? ? ? ? ? ? [facility] Adult PT Treatment/Exercise - 10/21/21 1524   ? ?  ? Lumbar Exercises: Stretches  ? Passive Hamstring Stretch Right;Left;2 reps;30 seconds   ? Passive Hamstring Stretch Limitations seated   ? Hip Flexor Stretch Right;Left;2 reps;30 seconds   ?  ? Lumbar Exercises: Aerobic  ? Tread Mill 5 min 1.   ?  ? Lumbar Exercises: Standing  ? Shoulder Extension 10 reps   ? Theraband Level (Shoulder Extension) Level 2 (Red)   ? Shoulder Extension Limitations 3 sec hold end range   ? Other Standing Lumbar Exercises standing hip abduction 2 x 10 red TB, standing hip extension 2 x 10 red TB   ? Other Standing Lumbar Exercises pallof red TB x 10 bilat   ?  ? Lumbar  Exercises: Seated  ? Sit to Stand Limitations sit to stand holding 3# wt with cues for core activation and full trunk extension x 10   ?  ? Lumbar Exercises: Supine  ? Ab Set 10 reps;5 seconds   ?  ? Manual Therapy  ? Manual Therapy Joint mobilization;Soft tissue mobilization   ? Joint Mobilization grade 2-3 CPAs and UPAs SIJ   ? Soft tissue mobilization STM and TPR Rt glutes and piriformis   ? ?  ?  ? ?  ? ? ? ? ? ? ? ? ? ? ? ? ? ? ? PT Long Term Goals - 10/21/21 0716   ? ?  ? PT LONG TERM GOAL #1  ? Title Pt will be independent with her HEP and progressions   ? Time 6   ? Period Weeks   ? Status New   ? Target Date 12/02/21   ?  ? PT LONG TERM GOAL #2  ? Title Pt will demo increased bilat hip strength to at least 4+/5   ? Time 6   ? Period Weeks   ? Status  New   ? Target Date 12/02/21   ?  ? PT LONG TERM GOAL #3  ? Title Pt will have R = L hamstring flexibility to demo decreased muscle imbalance   ? Time 6   ? Period Weeks   ? Status New   ? Target Date 12/02/21   ?  ? PT LONG TERM GOAL #4  ? 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 Time 6   ? Period Weeks   ? Status New   ? Target Date 12/02/21   ?  ? PT LONG TERM GOAL #5  ? Title Pt will have improved FOTO score to 74   ? Time 6   ? Period Weeks   ? Status New   ? Target Date 12/02/21   ? ?  ?  ? ?  ? ? ? ? ? ? ? ? Plan - 10/21/21 1619   ? ? Clinical Impression Statement Pt with good tolerance to exercise progression today, good response to manual therapy.   ? PT Next Visit Plan progress HEP, core strengthening and stretching   ? PT Home Exercise Plan Access Code [email or web address]   ? Consulted and Agree with Plan of Care Patient   ? ?  ?  ? ?  ? ? ?Patient will benefit from skilled therapeutic intervention in order to improve the following deficits and impairments:    ? ?Visit Diagnosis: ?Muscle weakness (generalized) ? ?Other abnormalities of gait and mobility ? ?Pain in right hip ? ?Stiffness of right hip, not  elsewhere classified ? ? ? ? ?Problem List ?Patient Active Problem List  ? Diagnosis Date Noted  ? Lumbar spondylosis 10/13/2021  ? Anxiety 12/25/2020  ? GERD (gastroesophageal reflux disease) 12/25/2020  ? Hyperlipidemia 12/25/2020  ? History of iron deficiency anemia 12/25/2020  ? Low vitamin D level 12/25/2020  ? Metabolic syndrome 12/25/2020  ? Mild persistent asthma 12/25/2020  ? Morbid obesity (HCC) 12/25/2020  ? Prediabetes 12/25/2020  ? ? ?Alexus Galka, PT ?10/21/2021, 4:23 PM ? ?[place] ?[facility] ?[address] [place] [address] ?[place], [place], [address] ?Phone: [phone]   Fax:  [phone] ? ?Name: Shawna Barnett ?MRN: [id number] ?Date of Birth: 1986-04-11 ? ? ? ?

## 2021-10-26 ENCOUNTER — Encounter: Payer: Self-pay | Admitting: Physical Therapy

## 2021-10-26 ENCOUNTER — Other Ambulatory Visit: Payer: Self-pay

## 2021-10-26 ENCOUNTER — Ambulatory Visit: Payer: 59 | Admitting: Physical Therapy

## 2021-10-26 DIAGNOSIS — R2689 Other abnormalities of gait and mobility: Secondary | ICD-10-CM

## 2021-10-26 DIAGNOSIS — M25551 Pain in right hip: Secondary | ICD-10-CM

## 2021-10-26 DIAGNOSIS — M6281 Muscle weakness (generalized): Secondary | ICD-10-CM | POA: Diagnosis not present

## 2021-10-26 DIAGNOSIS — M25651 Stiffness of right hip, not elsewhere classified: Secondary | ICD-10-CM

## 2021-10-26 NOTE — Therapy (Signed)
[place] ?[facility] ?[address] [place] [address] ?[place], [place], [address] ?Phone: [phone]   Fax:  [phone] ? ?Physical Therapy Treatment ? ?Patient Details  ?Name: Shawna Barnett ?MRN: [id number] ?Date of Birth: 1985/09/03 ?Referring Provider (PT): Monica Becton, MD ? ? ?Encounter Date: 10/26/2021 ? ? PT End of Session - 10/26/21 0846   ? ? Visit Number 3   ? Number of Visits 12   ? Date for PT Re-Evaluation 12/02/21   ? Authorization Type Aetna   ? PT Start Time [phone]   ? PT Stop Time 0929   ? PT Time Calculation (min) 43 min   ? Activity Tolerance Patient tolerated treatment well   ? Behavior During Therapy [facility] for tasks assessed/performed   ? ?  ?  ? ?  ? ? ?Past Medical History:  ?Diagnosis Date  ? Anxiety   ? Asthma   ? Depression   ? Hyperlipidemia   ? ? ?Past Surgical History:  ?Procedure Laterality Date  ? OPEN REPAIR PERIARTICULAR FRACTURE / DISLOCATION ELBOW    ? TONSILLECTOMY    ? ? ?There were no vitals filed for this visit. ? ? Subjective Assessment - 10/26/21 0847   ? ? Subjective Patient states she had severe pain for 4 days after last visit, so she hasn't done any of her HEP. Better today.   ? Patient Stated Goals Exercises to decrease pain   ? Currently in Pain? Yes   ? Pain Score 2    ? Pain Location Hip   ? Pain Orientation Right;Posterior   ? Pain Descriptors / Indicators Aching   ? Pain Type Chronic pain   ? ?  ?  ? ?  ? ? ? ? ? ? ? ? ? ? ? ? ? ? ? ? ? ? ? ? [facility] Adult PT Treatment/Exercise - 10/26/21 0001   ? ?  ? Lumbar Exercises: Stretches  ? Hip Flexor Stretch Right;Left;2 reps;30 seconds   ? Figure 4 Stretch 2 reps;30 seconds;Seated   ? Figure 4 Stretch Limitations both   ?  ? Lumbar Exercises: Aerobic  ? Tread Mill 5 min 1.   ?  ? Lumbar Exercises: Standing  ? Functional Squats 10 reps   ? Shoulder Extension 20 reps   ? Theraband Level (Shoulder Extension) Level 3 (Green)   ? Other Standing Lumbar Exercises dead lifts 10# 3x10    ? Other Standing Lumbar Exercises pallof red TB x 10 bilat   ?  ? Lumbar Exercises: Seated  ? Long [employer] on [place] Both;10 reps   ? LAQ on [employer] (lbs) red Tball   ? Hip Flexion on Ball Both;10 reps   ? Hip Flexion on Ball Limitations red Tball   ?  ? Lumbar Exercises: Supine  ? Heel Slides 10 reps   ? Heel Slides Limitations bil: more of leg press than slide   ? Bent Knee Raise 10 reps   ? Bent Knee Raise Limitations bil with ab set   ?  ? Lumbar Exercises: Prone  ? Straight Leg Raise 10 reps   ? Straight Leg Raises Limitations bil alternating   ? Opposite Arm/Leg Raise Right arm/Left leg;Left arm/Right leg;10 reps   ?  ? Lumbar Exercises: Quadruped  ? Plank modified on knees 1 rep x 30 sec   ? ?  ?  ? ?  ? ? ? ? ? ? ? ? ? ? PT Education - 10/26/21 0927   ? ?  Education Details HEP   ? ?  ?  ? ?  ? ? ? ? ? ? PT Long Term Goals - 10/21/21 0716   ? ?  ? PT LONG TERM GOAL #1  ? Title Pt will be independent with her HEP and progressions   ? Time 6   ? Period Weeks   ? Status New   ? Target Date 12/02/21   ?  ? PT LONG TERM GOAL #2  ? Title Pt will demo increased bilat hip strength to at least 4+/5   ? Time 6   ? Period Weeks   ? Status New   ? Target Date 12/02/21   ?  ? PT LONG TERM GOAL #3  ? Title Pt will have R = L hamstring flexibility to demo decreased muscle imbalance   ? Time 6   ? Period Weeks   ? Status New   ? Target Date 12/02/21   ?  ? PT LONG TERM GOAL #4  ? 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 Time 6   ? Period Weeks   ? Status New   ? Target Date 12/02/21   ?  ? PT LONG TERM GOAL #5  ? Title Pt will have improved FOTO score to 74   ? Time 6   ? Period Weeks   ? Status New   ? Target Date 12/02/21   ? ?  ?  ? ?  ? ? ? ? ? ? ? ? Plan - 10/26/21 0929   ? ? Clinical Impression Statement Patient tolerated new TE well with no complaints of pain. She has good form with functional exercises.   ? PT Frequency 2x / week   ? PT Duration 6 weeks   ?  PT Treatment/Interventions ADLs/Self Care Home Management;Aquatic Therapy;Cryotherapy;Electrical Stimulation;Iontophoresis 4mg /ml Dexamethasone;Moist Heat;Traction;Gait training;Stair training;Functional mobility training;Therapeutic exercise;Therapeutic activities;Neuromuscular re-education;Balance training;Manual techniques;Patient/family education;Passive range of motion;Dry needling;Taping   ? PT Next Visit Plan progress HEP, core strengthening and stretching   ? PT Home Exercise Plan Access Code [email or web address]   ? Consulted and Agree with Plan of Care Patient   ? ?  ?  ? ?  ? ? ?Patient will benefit from skilled therapeutic intervention in order to improve the following deficits and impairments:  Decreased range of motion, Difficulty walking, Increased fascial restricitons, Increased muscle spasms, Pain, Improper body mechanics, Decreased mobility, Decreased strength, Postural dysfunction, Impaired flexibility, Hypomobility ? ?Visit Diagnosis: ?Muscle weakness (generalized) ? ?Other abnormalities of gait and mobility ? ?Pain in right hip ? ?Stiffness of right hip, not elsewhere classified ? ? ? ? ?Problem List ?Patient Active Problem List  ? Diagnosis Date Noted  ? Lumbar spondylosis 10/13/2021  ? Anxiety 12/25/2020  ? GERD (gastroesophageal reflux disease) 12/25/2020  ? Hyperlipidemia 12/25/2020  ? History of iron deficiency anemia 12/25/2020  ? Low vitamin D level 12/25/2020  ? Metabolic syndrome 12/25/2020  ? Mild persistent asthma 12/25/2020  ? Morbid obesity (HCC) 12/25/2020  ? Prediabetes 12/25/2020  ? ? ?12/27/2020, PT ?10/26/2021, 9:32 AM ? ?[place] ?[facility] ?[address] ?[place], [place], [place] ?Phone: [phone]   Fax:  [phone] ? ?Name: [phone] Shawna Barnett ?MRN: Shawna ?Date of Birth: 05/22/1986 ? ? ? ?

## 2021-10-26 NOTE — Patient Instructions (Signed)
Access Code: [email or web address] ?URL: [email or web address] ?Date: 10/26/2021 ?Prepared by: Raynelle Fanning ? ?Exercises ?- Seated Hamstring Stretch  - 2 x daily - 7 x weekly - 2 sets - 10 reps ?- Seated Hip Flexor Stretch  - 2 x daily - 7 x weekly - 2 sets - 30 sec hold ?- Standing Hip Abduction Kicks  - 1 x daily - 7 x weekly - 2 sets - 10 reps ?- Standing Hip Extension Kicks  - 1 x daily - 7 x weekly - 2 sets - 10 reps ?- Supine Posterior Pelvic Tilt  - 1 x daily - 7 x weekly - 2 sets - 10 reps - 5-10 sec hold ?- Seated Piriformis Stretch with Trunk Bend  - 2 x daily - 7 x weekly - 1 sets - 3 reps - 30-60 sec  hold ?- Beginner Prone Single Leg Raise  - 1 x daily - 7 x weekly - 3 sets - 10 reps ?- Prone Alternating Arm and Leg Lifts  - 1 x daily - 7 x weekly - 3 sets - 10 reps - 5 sec hold ?

## 2021-10-28 ENCOUNTER — Other Ambulatory Visit: Payer: Self-pay

## 2021-10-28 ENCOUNTER — Ambulatory Visit: Payer: 59 | Admitting: Rehabilitative and Restorative Service Providers"

## 2021-10-28 ENCOUNTER — Encounter: Payer: Self-pay | Admitting: Rehabilitative and Restorative Service Providers"

## 2021-10-28 DIAGNOSIS — M6281 Muscle weakness (generalized): Secondary | ICD-10-CM

## 2021-10-28 DIAGNOSIS — M25551 Pain in right hip: Secondary | ICD-10-CM

## 2021-10-28 DIAGNOSIS — M25651 Stiffness of right hip, not elsewhere classified: Secondary | ICD-10-CM

## 2021-10-28 DIAGNOSIS — R2689 Other abnormalities of gait and mobility: Secondary | ICD-10-CM

## 2021-10-28 NOTE — Therapy (Signed)
[place] ?[facility] ?[address] ?[place], [place], [address] ?Phone: [phone]   Fax:  [phone] ? ?Physical Therapy Treatment ? ?Patient Details  ?Name: Shawna Barnett ?MRN: [id number] ?Date of Birth: 05/09/1986 ?Referring Provider (PT): Monica Bectonhekkekandam, Thomas J, MD ? ? ?Encounter Date: 10/28/2021 ? ? PT End of Session - 10/28/21 0843   ? ? Visit Number 4   ? Number of Visits 12   ? Date for PT Re-Evaluation 12/02/21   ? Authorization Type Aetna   ? PT Start Time [phone]   ? PT Stop Time 0930   ? PT Time Calculation (min) 47 min   ? Activity Tolerance Patient tolerated treatment well   ? ?  ?  ? ?  ? ? ?Past Medical History:  ?Diagnosis Date  ? Anxiety   ? Asthma   ? Depression   ? Hyperlipidemia   ? ? ?Past Surgical History:  ?Procedure Laterality Date  ? OPEN REPAIR PERIARTICULAR FRACTURE / DISLOCATION ELBOW    ? TONSILLECTOMY    ? ? ?There were no vitals filed for this visit. ? ? Subjective Assessment - 10/28/21 0843   ? ? Subjective Patient reports that she did well following last visit. She had some muscle soreness in the hamstrings but no pain. She walked on the trreadmill yesterday. Did exercises twice on Monday. Once in the clinic and again that night. She experienced soreness from doing the exercises twice on Monday. No pain or soreness in the Rt hip today. Will be on meloxicam daily this week then start RPN next week. Discussed tapering medication.   ? Currently in Pain? No/denies   ? Pain Score 0-No pain   ? ?  ?  ? ?  ? ? ? ? ? [facility] PT Assessment - 10/28/21 0001   ? ?  ? Assessment  ? Medical Diagnosis M47.816 (ICD-10-CM) - Lumbar spondylosis   ? Referring Provider (PT) Monica Bectonhekkekandam, Thomas J, MD   ? Prior Therapy For L elbow   ?  ? Palpation  ? Palpation comment tight Rt > Lt hip psoas/hip flexors   ? ?  ?  ? ?  ? ? ? ? ? ? ? ? ? ? ? ? ? ? ? ? [facility] Adult PT Treatment/Exercise - 10/28/21 0001   ? ?  ? Posture/Postural Control  ? Posture Comments  education re - avoiding hyperextension of knees with standing   ?  ? Self-Care  ? Self-Care Other Self-Care Comments   ? Other Self-Care Comments  myofacial ball release psoas bilat prone with 4 in plastic ball   ?  ? Lumbar Exercises: Stretches  ? Hip Flexor Stretch Right;Left;2 reps;30 seconds   ? Hip Flexor Stretch Limitations sitting VC to tighten core avoiding anterior pelvic tilt and added supine Thomas position   ? Figure 4 Stretch 2 reps;30 seconds;Seated   ? Figure 4 Stretch Limitations both   ? Other Lumbar Stretch Exercise trunk flexion in sitting 20-30 sec x 2 reps; trial of lateral trunk flexion 20 sec x 1 reps each side; sanding lat stretch 20 sec x 2 reps   ?  ? Lumbar Exercises: Aerobic  ? Tread Mill 8 min 2.0 mph   ?  ? Lumbar Exercises: Standing  ? Functional Squats 10 reps   ? Functional Squats Limitations VC for hinged hip and knee bend with core engaged   ? Shoulder Extension Strengthening;Both;10 reps;Theraband   ? Theraband Level (Shoulder Extension) Level 4 (Blue)   ?  Other Standing Lumbar Exercises dead lifts 15# KB  2x10   ? Other Standing Lumbar Exercises pallof blue TB x 10 bilat   ?  ? Lumbar Exercises: Seated  ? Sit to Stand 5 reps   ? Sit to Stand Limitations VC to tighten core and hing hip and bend knees   ? ?  ?  ? ?  ? ? ? ? ? ? ? ? ? ? ? ? ? ? ? PT Long Term Goals - 10/21/21 0716   ? ?  ? PT LONG TERM GOAL #1  ? Title Pt will be independent with her HEP and progressions   ? Time 6   ? Period Weeks   ? Status New   ? Target Date 12/02/21   ?  ? PT LONG TERM GOAL #2  ? Title Pt will demo increased bilat hip strength to at least 4+/5   ? Time 6   ? Period Weeks   ? Status New   ? Target Date 12/02/21   ?  ? PT LONG TERM GOAL #3  ? Title Pt will have R = L hamstring flexibility to demo decreased muscle imbalance   ? Time 6   ? Period Weeks   ? Status New   ? Target Date 12/02/21   ?  ? PT LONG TERM GOAL #4  ? 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 Time 6   ? Period Weeks   ? Status New   ? Target Date 12/02/21   ?  ? PT LONG TERM GOAL #5  ? Title Pt will have improved FOTO score to 74   ? Time 6   ? Period Weeks   ? Status New   ? Target Date 12/02/21   ? ?  ?  ? ?  ? ? ? ? ? ? ? ? Plan - 10/28/21 0849   ? ? Clinical Impression Statement Good response to exercises at last visit. No pain or discomfort in hip today. Note tightness in Rt > Lt hip flexors with palpation. Added hip flexor stretch in supine and sitting as well as myofacial ball release with 4 inch ball lying prone. Continued with prior exercise program. Progressing well toward stated goals of therapy.   ? Rehab Potential Good   ? PT Frequency 2x / week   ? PT Duration 6 weeks   ? PT Treatment/Interventions ADLs/Self Care Home Management;Aquatic Therapy;Cryotherapy;Electrical Stimulation;Iontophoresis 4mg /ml Dexamethasone;Moist Heat;Traction;Gait training;Stair training;Functional mobility training;Therapeutic exercise;Therapeutic activities;Neuromuscular re-education;Balance training;Manual techniques;Patient/family education;Passive range of motion;Dry needling;Taping   ? PT Next Visit Plan progress HEP, core strengthening and stretching   ? PT Home Exercise Plan   ? Consulted and Agree with Plan of Care Patient   ? ?  ?  ? ?  ? ? ?Patient will benefit from skilled therapeutic intervention in order to improve the following deficits and impairments:    ? ?Visit Diagnosis: ?Muscle weakness (generalized) ? ?Other abnormalities of gait and mobility ? ?Pain in right hip ? ?Stiffness of right hip, not elsewhere classified ? ? ? ? ?Problem List ?Patient Active Problem List  ? Diagnosis Date Noted  ? Lumbar spondylosis 10/13/2021  ? Anxiety 12/25/2020  ? GERD (gastroesophageal reflux disease) 12/25/2020  ? Hyperlipidemia 12/25/2020  ? History of iron deficiency anemia 12/25/2020  ? Low vitamin D level 12/25/2020  ? Metabolic syndrome 12/25/2020  ? Mild persistent asthma  12/25/2020  ? Morbid obesity (HCC) 12/25/2020  ? Prediabetes 12/25/2020  ? ? ?Val Riles, PT, MPH  ?10/28/2021, 9:35 AM ? ?[place] ?[facility] ?[address] ?[place], [place], [address] ?Phone: [phone]   Fax:  [phone] ? ?Name: Shawna Barnett ?MRN: [id number] ?Date of Birth: Sep 20, 1985 ? ? ? ?

## 2021-11-09 ENCOUNTER — Ambulatory Visit: Payer: 59 | Attending: Sports Medicine | Admitting: Physical Therapy

## 2021-11-09 DIAGNOSIS — M25651 Stiffness of right hip, not elsewhere classified: Secondary | ICD-10-CM | POA: Diagnosis present

## 2021-11-09 DIAGNOSIS — M6281 Muscle weakness (generalized): Secondary | ICD-10-CM | POA: Diagnosis present

## 2021-11-09 DIAGNOSIS — M25551 Pain in right hip: Secondary | ICD-10-CM | POA: Diagnosis present

## 2021-11-09 DIAGNOSIS — R2689 Other abnormalities of gait and mobility: Secondary | ICD-10-CM | POA: Insufficient documentation

## 2021-11-09 NOTE — Therapy (Signed)
[place] ?[facility] ?[place] ?[place], [place], [address] ?Phone: [phone]   Fax:  [phone] ? ?Physical Therapy Treatment ? ?Patient Details  ?Name: Shawna Barnett ?MRN: [id number] ?Date of Birth: Dec 09, 1985 ?Referring Provider (PT): Silverio Decamp, MD ? ? ?Encounter Date: 11/09/2021 ? ? PT End of Session - 11/09/21 0708   ? ? Visit Number 5   ? Number of Visits 12   ? Date for PT Re-Evaluation 12/02/21   ? Authorization Type Aetna   ? PT Start Time 0710   ? PT Stop Time 0755   ? PT Time Calculation (min) 45 min   ? Activity Tolerance Patient tolerated treatment well   ? Behavior During Therapy [facility] for tasks assessed/performed   ? ?  ?  ? ?  ? ? ?Past Medical History:  ?Diagnosis Date  ? Anxiety   ? Asthma   ? Depression   ? Hyperlipidemia   ? ? ?Past Surgical History:  ?Procedure Laterality Date  ? OPEN REPAIR PERIARTICULAR FRACTURE / DISLOCATION ELBOW    ? TONSILLECTOMY    ? ? ?There were no vitals filed for this visit. ? ? Subjective Assessment - 11/09/21 0711   ? ? Subjective Pt states she had her in laws over and started a new job. Pt reports she hasn't been able to do anything at all this week (including walking). Pt reports with meloxicam she can still feel it.   ? Limitations Standing;Walking   ? How long can you sit comfortably? No issues but can feel it after a couple of hours   ? How long can you stand comfortably? ~2 hours before pain becomes stabbing   ? How long can you walk comfortably? ~1 mile can feel discomfort   ? Patient Stated Goals Exercises to decrease pain   ? Currently in Pain? Yes   ? Pain Score 2    ? Pain Location Hip   ? Pain Orientation Right;Posterior   ? Pain Descriptors / Indicators Aching   ? Pain Type Chronic pain   ? ?  ?  ? ?  ? ? ? ? ? [facility] PT Assessment - 11/09/21 0001   ? ?  ? Assessment  ? Medical Diagnosis M47.816 (ICD-10-CM) - Lumbar spondylosis   ? Referring Provider (PT) Silverio Decamp, MD    ? Prior Therapy For L elbow   ?  ? Strength  ? Right Hip Extension 4/5   ? Right Hip ABduction 4/5   ? Right Hip ADduction 4/5   ? Left Hip Extension 4+/5   ? Left Hip ABduction 4/5   ? Left Hip ADduction 4+/5   ?  ? Palpation  ? Palpation comment continued tenderness in upper glute   ? ?  ?  ? ?  ? ? ? ? ? ? ? ? ? ? ? ? ? ? ? ? [place] Adult PT Treatment/Exercise - 11/09/21 0001   ? ?  ? Lumbar Exercises: Stretches  ? Passive Hamstring Stretch Right;Left;2 reps;30 seconds   ? Passive Hamstring Stretch Limitations supine with strap   ? Hip Flexor Stretch Right;Left;2 reps;30 seconds   ? Figure 4 Stretch 2 reps;30 seconds;Seated   ? Figure 4 Stretch Limitations both   ?  ? Lumbar Exercises: Aerobic  ? Tread Mill 5 min 1.7 mph   ?  ? Lumbar Exercises: Standing  ? Functional Squats 20 reps   ? Functional Squats Limitations VC for hinged hip and  knee bend with core engaged   ? Shoulder Extension Strengthening;Both;Theraband;20 reps   3 sec hold  ? Theraband Level (Shoulder Extension) Level 4 (Blue)   ? Other Standing Lumbar Exercises pallof blue TB 10x3 sec bilat   ?  ? Lumbar Exercises: Seated  ? Long [employer] on [place] Both;20 reps   ? LAQ on [employer] (lbs) red Tball   ? Hip Flexion on Ball Both;20 reps   ? Hip Flexion on Ball Limitations red Tball   ?  ? Lumbar Exercises: Sidelying  ? Hip Abduction Right;20 reps   ? Other Sidelying Lumbar Exercises hip adduction 2x10   ?  ? Lumbar Exercises: Prone  ? Straight Leg Raise 10 reps   ? Straight Leg Raises Limitations bil alternating   ? ?  ?  ? ?  ? ? ? ? ? ? ? ? ? ? ? ? ? ? ? PT Long Term Goals - 10/21/21 0716   ? ?  ? PT LONG TERM GOAL #1  ? Title Pt will be independent with her HEP and progressions   ? Time 6   ? Period Weeks   ? Status New   ? Target Date 12/02/21   ?  ? PT LONG TERM GOAL #2  ? Title Pt will demo increased bilat hip strength to at least 4+/5   ? Time 6   ? Period Weeks   ? Status New   ? Target Date 12/02/21   ?  ? PT LONG TERM GOAL #3  ? Title Pt  will have R = L hamstring flexibility to demo decreased muscle imbalance   ? Time 6   ? Period Weeks   ? Status New   ? Target Date 12/02/21   ?  ? PT LONG TERM GOAL #4  ? 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 Time 6   ? Period Weeks   ? Status New   ? Target Date 12/02/21   ?  ? PT LONG TERM GOAL #5  ? Title Pt will have improved FOTO score to 74   ? Time 6   ? Period Weeks   ? Status New   ? Target Date 12/02/21   ? ?  ?  ? ?  ? ? ? ? ? ? ? ? Plan - 11/09/21 0739   ? ? Clinical Impression Statement Rechecked pt's strength -- has been increasing by half a muscle grade. R hip continues to be tighter than L. Continued to work on hip and core strengthening.   ? Personal Factors and Comorbidities Age;Fitness;Time since onset of injury/illness/exacerbation;Profession   ? Examination-Activity Limitations Locomotion Level;Sit;Squat;Stand;Transfers   ? Examination-Participation Restrictions Meal Prep;Cleaning;Community Activity;Occupation   ? Rehab Potential Good   ? PT Frequency 2x / week   ? PT Duration 6 weeks   ? PT Treatment/Interventions ADLs/Self Care Home Management;Aquatic Therapy;Cryotherapy;Electrical Stimulation;Iontophoresis 4mg /ml Dexamethasone;Moist Heat;Traction;Gait training;Stair training;Functional mobility training;Therapeutic exercise;Therapeutic activities;Neuromuscular re-education;Balance training;Manual techniques;Patient/family education;Passive range of motion;Dry needling;Taping   ? PT Next Visit Plan progress HEP, core strengthening and stretching   ? PT [place]   ? Consulted and Agree with Plan of Care Patient   ? ?  ?  ? ?  ? ? ?Patient will benefit from skilled therapeutic intervention in order to improve the following deficits and impairments:  Decreased range of motion, Difficulty walking, Increased fascial restricitons, Increased muscle spasms,  Pain, Improper body mechanics, Decreased mobility, Decreased strength,  Postural dysfunction, Impaired flexibility, Hypomobility ? ?Visit Diagnosis: ?Muscle weakness (generalized) ? ?Other abnormalities of gait and mobility ? ?Pain in right hip ? ?Stiffness of right hip, not elsewhere classified ? ? ? ? ?Problem List ?Patient Active Problem List  ? Diagnosis Date Noted  ? Lumbar spondylosis 10/13/2021  ? Anxiety 12/25/2020  ? GERD (gastroesophageal reflux disease) 12/25/2020  ? Hyperlipidemia 12/25/2020  ? History of iron deficiency anemia 12/25/2020  ? Low vitamin D level 12/25/2020  ? Metabolic syndrome 0000000  ? Mild persistent asthma 12/25/2020  ? Morbid obesity ([place]) 12/25/2020  ? Prediabetes 12/25/2020  ? ? ?Mande Auvil April Gordy Levan, PT, DPT ?11/09/2021, 7:55 AM ? ?[place] ?[facility] ?[place] ?[place], [place], [address] ?Phone: [phone]   Fax:  [phone] ? ?Name: Shawna Barnett ?MRN: [id number] ?Date of Birth: 07/29/1986 ? ? ? ?

## 2021-11-11 ENCOUNTER — Ambulatory Visit: Payer: 59 | Admitting: Physical Therapy

## 2021-11-11 DIAGNOSIS — M25551 Pain in right hip: Secondary | ICD-10-CM

## 2021-11-11 DIAGNOSIS — R2689 Other abnormalities of gait and mobility: Secondary | ICD-10-CM

## 2021-11-11 DIAGNOSIS — M6281 Muscle weakness (generalized): Secondary | ICD-10-CM

## 2021-11-11 DIAGNOSIS — M25651 Stiffness of right hip, not elsewhere classified: Secondary | ICD-10-CM

## 2021-11-11 NOTE — Therapy (Signed)
[place] ?[facility] ?[place] ?[place], [place], [address] ?Phone: [phone]   Fax:  [phone] ? ?Physical Therapy Treatment ? ?Patient Details  ?Name: Shawna Barnett ?MRN: [id number] ?Date of Birth: 06-21-1986 ?Referring Provider (PT): Silverio Decamp, MD ? ? ?Encounter Date: 11/11/2021 ? ? PT End of Session - 11/11/21 0711   ? ? Visit Number 6   ? Number of Visits 12   ? Date for PT Re-Evaluation 12/02/21   ? Authorization Type Aetna   ? PT Start Time [phone]   ? PT Stop Time 0750   ? PT Time Calculation (min) 39 min   ? Activity Tolerance Patient tolerated treatment well   ? Behavior During Therapy [facility] for tasks assessed/performed   ? ?  ?  ? ?  ? ? ?Past Medical History:  ?Diagnosis Date  ? Anxiety   ? Asthma   ? Depression   ? Hyperlipidemia   ? ? ?Past Surgical History:  ?Procedure Laterality Date  ? OPEN REPAIR PERIARTICULAR FRACTURE / DISLOCATION ELBOW    ? TONSILLECTOMY    ? ? ?There were no vitals filed for this visit. ? ? Subjective Assessment - 11/11/21 0714   ? ? Subjective Pt reports "healthy" muscle soreness after last session.   ? Limitations Standing;Walking   ? How long can you sit comfortably? No issues but can feel it after a couple of hours   ? How long can you stand comfortably? ~2 hours before pain becomes stabbing   ? How long can you walk comfortably? ~1 mile can feel discomfort   ? Patient Stated Goals Exercises to decrease pain   ? Currently in Pain? Yes   ? Pain Score 2    ? Pain Location Hip   ? Pain Orientation Right;Posterior   ? Pain Descriptors / Indicators Aching   ? Pain Type Chronic pain   ? Pain Onset More than a month ago   ? ?  ?  ? ?  ? ? ? ? ? [facility] PT Assessment - 11/11/21 0001   ? ?  ? Assessment  ? Medical Diagnosis M47.816 (ICD-10-CM) - Lumbar spondylosis   ? Referring Provider (PT) Silverio Decamp, MD   ? ?  ?  ? ?  ? ? ? ? ? ? ? ? ? ? ? ? ? ? ? ? [place] Adult PT Treatment/Exercise - 11/11/21 0001    ? ?  ? Lumbar Exercises: Stretches  ? Passive Hamstring Stretch Right;Left;30 seconds   ? Passive Hamstring Stretch Limitations seated   ? Hip Flexor Stretch Right;Left;30 seconds;2 reps   ? Figure 4 Stretch 2 reps;30 seconds;Seated   ? Figure 4 Stretch Limitations both   ?  ? Lumbar Exercises: Aerobic  ? Tread Mill 5 min 1.8 mph   ?  ? Lumbar Exercises: Standing  ? Functional Squats 10 reps   ? Functional Squats Limitations with 10# KB   ? Other Standing Lumbar Exercises dead lifts 15# KB  2x10   ?  ? Lumbar Exercises: Supine  ? Bent Knee Raise 10 reps   ? Bent Knee Raise Limitations bil with ab set   ? Dead Bug 20 reps   ?  ? Lumbar Exercises: Prone  ? Opposite Arm/Leg Raise Right arm/Left leg;Left arm/Right leg;20 reps   ?  ? Lumbar Exercises: Quadruped  ? Opposite Arm/Leg Raise Right arm/Left leg;Left arm/Right leg;20 reps   ? Other Quadruped Lumbar Exercises primal push up  2x30 sec   ? Other Quadruped Lumbar Exercises fire hydrants 2x10   ? ?  ?  ? ?  ? ? ? ? ? ? ? ? ? ? ? ? ? ? ? PT Long Term Goals - 10/21/21 0716   ? ?  ? PT LONG TERM GOAL #1  ? Title Pt will be independent with her HEP and progressions   ? Time 6   ? Period Weeks   ? Status New   ? Target Date 12/02/21   ?  ? PT LONG TERM GOAL #2  ? Title Pt will demo increased bilat hip strength to at least 4+/5   ? Time 6   ? Period Weeks   ? Status New   ? Target Date 12/02/21   ?  ? PT LONG TERM GOAL #3  ? Title Pt will have R = L hamstring flexibility to demo decreased muscle imbalance   ? Time 6   ? Period Weeks   ? Status New   ? Target Date 12/02/21   ?  ? PT LONG TERM GOAL #4  ? 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 Time 6   ? Period Weeks   ? Status New   ? Target Date 12/02/21   ?  ? PT LONG TERM GOAL #5  ? Title Pt will have improved FOTO score to 74   ? Time 6   ? Period Weeks   ? Status New   ? Target Date 12/02/21   ? ?  ?  ? ?  ? ? ? ? ? ? ? ? Plan - 11/11/21 0750   ? ? Clinical  Impression Statement Session focused on core strengthening this session. Continued to work on hip strengthening and progressing lifting as able.   ? Personal Factors and Comorbidities Age;Fitness;Time since onset of injury/illness/exacerbation;Profession   ? Examination-Activity Limitations Locomotion Level;Sit;Squat;Stand;Transfers   ? Examination-Participation Restrictions Meal Prep;Cleaning;Community Activity;Occupation   ? Rehab Potential Good   ? PT Frequency 2x / week   ? PT Duration 6 weeks   ? PT Treatment/Interventions ADLs/Self Care Home Management;Aquatic Therapy;Cryotherapy;Electrical Stimulation;Iontophoresis 4mg /ml Dexamethasone;Moist Heat;Traction;Gait training;Stair training;Functional mobility training;Therapeutic exercise;Therapeutic activities;Neuromuscular re-education;Balance training;Manual techniques;Patient/family education;Passive range of motion;Dry needling;Taping   ? PT Next Visit Plan progress HEP, core strengthening and stretching   ? PT [place]   ? Consulted and Agree with Plan of Care Patient   ? ?  ?  ? ?  ? ? ?Patient will benefit from skilled therapeutic intervention in order to improve the following deficits and impairments:  Decreased range of motion, Difficulty walking, Increased fascial restricitons, Increased muscle spasms, Pain, Improper body mechanics, Decreased mobility, Decreased strength, Postural dysfunction, Impaired flexibility, Hypomobility ? ?Visit Diagnosis: ?Muscle weakness (generalized) ? ?Other abnormalities of gait and mobility ? ?Pain in right hip ? ?Stiffness of right hip, not elsewhere classified ? ? ? ? ?Problem List ?Patient Active Problem List  ? Diagnosis Date Noted  ? Lumbar spondylosis 10/13/2021  ? Anxiety 12/25/2020  ? GERD (gastroesophageal reflux disease) 12/25/2020  ? Hyperlipidemia 12/25/2020  ? History of iron deficiency anemia 12/25/2020  ? Low vitamin D level 12/25/2020  ? Metabolic syndrome 0000000  ? Mild persistent  asthma 12/25/2020  ? Morbid obesity ([place]) 12/25/2020  ? Prediabetes 12/25/2020  ? ? ?Pearlean Sabina April Gordy Levan, PT, DPT ?11/11/2021, 7:52 AM ? ?[place] [facility]  [address] ?[place], [place], [address] ?Phone: [phone]   Fax:  [phone] ? ?Name: Shawna Barnett ?MRN: [id number] ?Date of Birth: 08-12-1985 ? ? ? ?

## 2021-11-16 ENCOUNTER — Encounter: Payer: Self-pay | Admitting: Physical Therapy

## 2021-11-18 ENCOUNTER — Ambulatory Visit: Payer: 59 | Admitting: Physical Therapy

## 2021-11-18 DIAGNOSIS — M25551 Pain in right hip: Secondary | ICD-10-CM

## 2021-11-18 DIAGNOSIS — M25651 Stiffness of right hip, not elsewhere classified: Secondary | ICD-10-CM

## 2021-11-18 DIAGNOSIS — R2689 Other abnormalities of gait and mobility: Secondary | ICD-10-CM

## 2021-11-18 DIAGNOSIS — M6281 Muscle weakness (generalized): Secondary | ICD-10-CM

## 2021-11-18 NOTE — Therapy (Signed)
[place] [facility] ?[address] [place] [address] ?[place], [place], [address] ?Phone: [phone]   Fax:  [phone] ? ?Physical Therapy Treatment ? ?Patient Details  ?Name: Shawna Barnett ?MRN: [id number] ?Date of Birth: 1986/01/09 ?Referring Provider (PT): Monica Becton, MD ? ? ?Encounter Date: 11/18/2021 ? ? PT End of Session - 11/18/21 0711   ? ? Visit Number 7   ? Number of Visits 12   ? Date for PT Re-Evaluation 12/02/21   ? Authorization Type Aetna   ? PT Start Time [phone]   ? PT Stop Time 0755   ? PT Time Calculation (min) 42 min   ? Activity Tolerance Patient tolerated treatment well   ? Behavior During Therapy [facility] for tasks assessed/performed   ? ?  ?  ? ?  ? ? ?Past Medical History:  ?Diagnosis Date  ? Anxiety   ? Asthma   ? Depression   ? Hyperlipidemia   ? ? ?Past Surgical History:  ?Procedure Laterality Date  ? OPEN REPAIR PERIARTICULAR FRACTURE / DISLOCATION ELBOW    ? TONSILLECTOMY    ? ? ?There were no vitals filed for this visit. ? ? Subjective Assessment - 11/18/21 0715   ? ? Subjective Pt reports feeling tired this morning. [place] zoo this [place] it didn't hurt as bad but still hurt. Some days it really hurts but other days it doesn't hurt at all. Has not exercised this week. Pt will see Dr. Karie Schwalbe next week. Pt saw chiropractor Monday and got adjusted feels some soreness in mid back.   ? Limitations Standing;Walking   ? How long can you sit comfortably? No issues but can feel it after a couple of hours   ? How long can you stand comfortably? ~2 hours before pain becomes stabbing   ? How long can you walk comfortably? ~1 mile can feel discomfort   ? Patient Stated Goals Exercises to decrease pain   ? Currently in Pain? Yes   ? Pain Score 1    ? Pain Location Hip   ? Pain Orientation Right;Posterior   ? Pain Descriptors / Indicators Aching   ? Pain Type Chronic pain   ? Pain Onset More than a month ago   ? ?  ?  ? ?  ? ? ? ? ? [facility] PT Assessment -  11/18/21 0001   ? ?  ? Assessment  ? Medical Diagnosis M47.816 (ICD-10-CM) - Lumbar spondylosis   ? Referring Provider (PT) Monica Becton, MD   ? ?  ?  ? ?  ? ? ? ? ? ? ? ? ? ? ? ? ? ? ? ? [facility] Adult PT Treatment/Exercise - 11/18/21 0001   ? ?  ? Lumbar Exercises: Stretches  ? Passive Hamstring Stretch Right;Left;30 seconds   ? Passive Hamstring Stretch Limitations supine with strap   ? Piriformis Stretch Right;Left;30 seconds   ? Figure 4 Stretch 30 seconds;Supine;1 rep   ?  ? Lumbar Exercises: Aerobic  ? Tread Mill 5 min 1.7 mph   ?  ? Lumbar Exercises: Standing  ? Other Standing Lumbar Exercises hip adduction with wash rag x10   ?  ? Lumbar Exercises: Supine  ? Bent Knee Raise 20 reps   ? Bent Knee Raise Limitations bil with ab set   ? Dead Bug 20 reps   ?  ? Lumbar Exercises: Sidelying  ? Hip Abduction Right;20 reps   ? Hip Abduction Limitations red tband   ?  ?  Lumbar Exercises: Prone  ? Straight Leg Raise 10 reps   ? Straight Leg Raises Limitations bil alternating red tband   ?  ? Manual Therapy  ? Manual therapy comments PROM/stretch for hip flexors   ? Joint Mobilization grade 3 UPA R SIJ   ? Soft tissue mobilization STM and TPR Rt glutes and piriformis   ? ?  ?  ? ?  ? ? ? ? ? ? ? ? ? ? ? ? ? ? ? PT Long Term Goals - 10/21/21 0716   ? ?  ? PT LONG TERM GOAL #1  ? Title Pt will be independent with her HEP and progressions   ? Time 6   ? Period Weeks   ? Status New   ? Target Date 12/02/21   ?  ? PT LONG TERM GOAL #2  ? Title Pt will demo increased bilat hip strength to at least 4+/5   ? Time 6   ? Period Weeks   ? Status New   ? Target Date 12/02/21   ?  ? PT LONG TERM GOAL #3  ? Title Pt will have R = L hamstring flexibility to demo decreased muscle imbalance   ? Time 6   ? Period Weeks   ? Status New   ? Target Date 12/02/21   ?  ? PT LONG TERM GOAL #4  ? 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 Time 6   ? Period Weeks   ? Status New   ? Target  Date 12/02/21   ?  ? PT LONG TERM GOAL #5  ? Title Pt will have improved FOTO score to 74   ? Time 6   ? Period Weeks   ? Status New   ? Target Date 12/02/21   ? ?  ?  ? ?  ? ? ? ? ? ? ? ? Plan - 11/18/21 0756   ? ? Clinical Impression Statement Continued to progress hip strengthening and review core strengthening exercises. Manual work provided for R hip.   ? Personal Factors and Comorbidities Age;Fitness;Time since onset of injury/illness/exacerbation;Profession   ? Examination-Activity Limitations Locomotion Level;Sit;Squat;Stand;Transfers   ? Examination-Participation Restrictions Meal Prep;Cleaning;Community Activity;Occupation   ? Rehab Potential Good   ? PT Frequency 2x / week   ? PT Duration 6 weeks   ? PT Treatment/Interventions ADLs/Self Care Home Management;Aquatic Therapy;Cryotherapy;Electrical Stimulation;Iontophoresis 4mg /ml Dexamethasone;Moist Heat;Traction;Gait training;Stair training;Functional mobility training;Therapeutic exercise;Therapeutic activities;Neuromuscular re-education;Balance training;Manual techniques;Patient/family education;Passive range of motion;Dry needling;Taping   ? PT Next Visit Plan progress HEP, core strengthening and stretching   ? PT Home Exercise Plan   ? Consulted and Agree with Plan of Care Patient   ? ?  ?  ? ?  ? ? ?Patient will benefit from skilled therapeutic intervention in order to improve the following deficits and impairments:  Decreased range of motion, Difficulty walking, Increased fascial restricitons, Increased muscle spasms, Pain, Improper body mechanics, Decreased mobility, Decreased strength, Postural dysfunction, Impaired flexibility, Hypomobility ? ?Visit Diagnosis: ?Muscle weakness (generalized) ? ?Other abnormalities of gait and mobility ? ?Pain in right hip ? ?Stiffness of right hip, not elsewhere classified ? ? ? ? ?Problem List ?Patient Active Problem List  ? Diagnosis Date Noted  ? Lumbar spondylosis 10/13/2021  ? Anxiety 12/25/2020  ?  GERD (gastroesophageal reflux disease) 12/25/2020  ? Hyperlipidemia 12/25/2020  ? History of iron deficiency anemia 12/25/2020  ?  Low vitamin D level 12/25/2020  ? Metabolic syndrome 12/25/2020  ? Mild persistent asthma 12/25/2020  ? Morbid obesity (HCC) 12/25/2020  ? Prediabetes 12/25/2020  ? ? ?Dashonna Chagnon April Dell PontoMa L Pershing Skidmore, PT, DPT ?11/18/2021, 7:58 AM ? ?[place] [facility] ?[address] ?[place], [place], [address] ?Phone: [phone]   Fax:  [phone] ? ?Name: [name] ?MRN: [id number] ?Date of Birth: 12/13/1985 ? ? ? ?

## 2021-11-23 ENCOUNTER — Ambulatory Visit: Payer: 59 | Admitting: Physical Therapy

## 2021-11-23 DIAGNOSIS — M6281 Muscle weakness (generalized): Secondary | ICD-10-CM | POA: Diagnosis not present

## 2021-11-23 DIAGNOSIS — M25651 Stiffness of right hip, not elsewhere classified: Secondary | ICD-10-CM

## 2021-11-23 DIAGNOSIS — R2689 Other abnormalities of gait and mobility: Secondary | ICD-10-CM

## 2021-11-23 DIAGNOSIS — M25551 Pain in right hip: Secondary | ICD-10-CM

## 2021-11-23 NOTE — Therapy (Addendum)
[place] ?[facility] ?[address] ?[place], [place], [address] ?Phone: [phone]   Fax:  [phone] ? ?Physical Therapy Treatment ? ?Patient Details  ?Name: Shawna Barnett ?MRN: [id number] ?Date of Birth: [birth year] ?Referring Provider (PT): Silverio Decamp, MD ? ? ?Encounter Date: 11/23/2021 ? ? PT End of Session - 11/23/21 0714   ? ? Visit Number 8   ? Number of Visits 12   ? Date for PT Re-Evaluation 12/02/21   ? Authorization Type Aetna   ? PT Start Time [phone]   ? PT Stop Time 0755   ? PT Time Calculation (min) 41 min   ? Activity Tolerance Patient tolerated treatment well   ? Behavior During Therapy [facility] for tasks assessed/performed   ? ?  ?  ? ?  ? ? ?Past Medical History:  ?Diagnosis Date  ? Anxiety   ? Asthma   ? Depression   ? Hyperlipidemia   ? ? ?Past Surgical History:  ?Procedure Laterality Date  ? OPEN REPAIR PERIARTICULAR FRACTURE / DISLOCATION ELBOW    ? TONSILLECTOMY    ? ? ?There were no vitals filed for this visit. ? ? Subjective Assessment - 11/23/21 0715   ? ? Subjective Pt states she has a massage due this yesterday. Pt reports she did her exercises yesterday.   ? Limitations Standing;Walking   ? How long can you sit comfortably? No issues but can feel it after a couple of hours   ? How long can you stand comfortably? ~2 hours before pain becomes stabbing   ? How long can you walk comfortably? ~1 mile can feel discomfort   ? Patient Stated Goals Exercises to decrease pain   ? Currently in Pain? No/denies   ? Pain Score 2    ? Pain Onset More than a month ago   ? ?  ?  ? ?  ? ? ? ? ? [facility] PT Assessment - 11/23/21 0001   ? ?  ? Assessment  ? Medical Diagnosis M47.816 (ICD-10-CM) - Lumbar spondylosis   ? Referring Provider (PT) Silverio Decamp, MD   ? ?  ?  ? ?  ? ? ? ? ? ? ? ? ? ? ? ? ? ? ? ? [place] Adult PT Treatment/Exercise - 11/23/21 0001   ? ?  ? Lumbar Exercises: Stretches  ? Passive Hamstring Stretch Right;Left;30 seconds    ? Passive Hamstring Stretch Limitations supine with strap   ? Figure 4 Stretch 30 seconds;Supine;1 rep   ?  ? Lumbar Exercises: Standing  ? Other Standing Lumbar Exercises dead lifts 15# KB  2x10   ?  ? Lumbar Exercises: Sidelying  ? Hip Abduction Right;20 reps   ? Hip Abduction Limitations red tband   ? Other Sidelying Lumbar Exercises open/close book x10 each side   ?  ? Lumbar Exercises: Prone  ? Straight Leg Raise 10 reps   ? Straight Leg Raises Limitations bil alternating red tband   ? ?  ?  ? ?  ? ? ? ? ?Manual therapy: ? STM and TPR along lateral R glute, thorcolumbar paraspinals and lower trap/mid trap ? ? ? ? ? ? ? ? ? ? PT Long Term Goals - 10/21/21 0716   ? ?  ? PT LONG TERM GOAL #1  ? Title Pt will be independent with her HEP and progressions   ? Time 6   ? Period Weeks   ? Status New   ?  Target Date 12/02/21   ?  ? PT LONG TERM GOAL #2  ? Title Pt will demo increased bilat hip strength to at least 4+/5   ? Time 6   ? Period Weeks   ? Status New   ? Target Date 12/02/21   ?  ? PT LONG TERM GOAL #3  ? Title Pt will have R = L hamstring flexibility to demo decreased muscle imbalance   ? Time 6   ? Period Weeks   ? Status New   ? Target Date 12/02/21   ?  ? PT LONG TERM GOAL #4  ? 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 Time 6   ? Period Weeks   ? Status New   ? Target Date 12/02/21   ?  ? PT LONG TERM GOAL #5  ? Title Pt will have improved FOTO score to 74   ? Time 6   ? Period Weeks   ? Status New   ? Target Date 12/02/21   ? ?  ?  ? ?  ? ? ? ? ? ? ? ? Plan - 11/23/21 0746   ? ? Clinical Impression Statement Manual work for R hip and mid back tightness. Continued hip and core strengthening.   ? Personal Factors and Comorbidities Age;Fitness;Time since onset of injury/illness/exacerbation;Profession   ? Examination-Activity Limitations Locomotion Level;Sit;Squat;Stand;Transfers   ? Examination-Participation Restrictions Meal Prep;Cleaning;Community  Activity;Occupation   ? Rehab Potential Good   ? PT Frequency 2x / week   ? PT Duration 6 weeks   ? PT Treatment/Interventions ADLs/Self Care Home Management;Aquatic Therapy;Cryotherapy;Electrical Stimulation;Iontophoresis 4mg /ml Dexamethasone;Moist Heat;Traction;Gait training;Stair training;Functional mobility training;Therapeutic exercise;Therapeutic activities;Neuromuscular re-education;Balance training;Manual techniques;Patient/family education;Passive range of motion;Dry needling;Taping   ? PT Next Visit Plan progress HEP, core strengthening and stretching   ? PT [place]   ? Consulted and Agree with Plan of Care Patient   ? ?  ?  ? ?  ? ? ?Patient will benefit from skilled therapeutic intervention in order to improve the following deficits and impairments:  Decreased range of motion, Difficulty walking, Increased fascial restricitons, Increased muscle spasms, Pain, Improper body mechanics, Decreased mobility, Decreased strength, Postural dysfunction, Impaired flexibility, Hypomobility ? ?Visit Diagnosis: ?Muscle weakness (generalized) ? ?Other abnormalities of gait and mobility ? ?Pain in right hip ? ?Stiffness of right hip, not elsewhere classified ? ? ? ? ?Problem List ?Patient Active Problem List  ? Diagnosis Date Noted  ? Lumbar spondylosis 10/13/2021  ? Anxiety 12/25/2020  ? GERD (gastroesophageal reflux disease) 12/25/2020  ? Hyperlipidemia 12/25/2020  ? History of iron deficiency anemia 12/25/2020  ? Low vitamin D level 12/25/2020  ? Metabolic syndrome 0000000  ? Mild persistent asthma 12/25/2020  ? Morbid obesity ([place]) 12/25/2020  ? Prediabetes 12/25/2020  ? ? ?Alexxia Stankiewicz April Gordy Levan, PT, DPT ?11/23/2021, 7:53 AM ? ?[place] ?[facility] ?[address] [place] ?[place], [place], [address] ?Phone: [phone]   Fax:  [phone] ? ?Name: Shawna Barnett ?MRN: [id number] ?Date of Birth: 05-01-1986 ? ? ? ?

## 2021-11-24 ENCOUNTER — Ambulatory Visit: Payer: 59 | Admitting: Sports Medicine

## 2021-11-24 DIAGNOSIS — M47816 Spondylosis without myelopathy or radiculopathy, lumbar region: Secondary | ICD-10-CM

## 2021-11-24 NOTE — Progress Notes (Addendum)
? ? ?  Procedures performed today:   ? ?None. ? ?Independent interpretation of notes and tests performed by another provider:  ? ?None. ? ?Brief History, Exam, Impression, and Recommendations:   ? ?Lumbar spondylosis ?Shawna Barnett returns, she is a very pleasant 36 year old female, she has lost over 60 pounds on Wegovy, just switch to 2.4 mg and is tolerating now. 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 ?On exam today she also has some tenderness at the right sacroiliac joint. ?At this point she has done 6 weeks of formal physical therapy, NSAIDs, activity modification without significant improvement, we will proceed with MRI for injection planning, likely facet versus sacroiliac joint. ?If she has dramatic facet arthritis on the right we will set her up with a facet joint injection, otherwise I will bring her back for ultrasound-guided SI joint injection. ? ? ? ?___________________________________________ ?Ihor Austin. Benjamin Stain, M.D., ABFM., CAQSM. ?Primary Care and Sports Medicine ?[facility] ? ?Adjunct Instructor of Family Medicine  ?[employer] ?

## 2021-11-24 NOTE — Assessment & Plan Note (Addendum)
Shawna Barnett returns, she is a very pleasant 36 year old female, she has lost over 60 pounds on Wegovy, just switch to 2.4 mg and is tolerating now. 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 ?On exam today she also has some tenderness at the right sacroiliac joint. ?At this point she has done 6 weeks of formal physical therapy, NSAIDs, activity modification without significant improvement, we will proceed with MRI for injection planning, likely facet versus sacroiliac joint. ?If she has dramatic facet arthritis on the right we will set her up with a facet joint injection, otherwise I will bring her back for ultrasound-guided SI joint injection. ?

## 2021-11-25 ENCOUNTER — Ambulatory Visit: Payer: 59 | Admitting: Physical Therapy

## 2021-11-25 DIAGNOSIS — M25651 Stiffness of right hip, not elsewhere classified: Secondary | ICD-10-CM

## 2021-11-25 DIAGNOSIS — M6281 Muscle weakness (generalized): Secondary | ICD-10-CM | POA: Diagnosis not present

## 2021-11-25 DIAGNOSIS — R2689 Other abnormalities of gait and mobility: Secondary | ICD-10-CM

## 2021-11-25 DIAGNOSIS — M25551 Pain in right hip: Secondary | ICD-10-CM

## 2021-11-25 NOTE — Therapy (Signed)
[place] ?[facility] ?[address] ?[place], [place], [address] ?Phone: [phone]   Fax:  [phone] ? ?Physical Therapy Treatment ? ?Patient Details  ?Name: [name] ?MRN: [id number] ?Date of Birth: 09/24/1985 ?Referring Provider (PT): Monica Bectonhekkekandam, Thomas J, MD ? ? ?Encounter Date: 11/25/2021 ? ? PT End of Session - 11/25/21 0714   ? ? Visit Number 9   ? Number of Visits 12   ? Date for PT Re-Evaluation 12/02/21   ? Authorization Type Aetna   ? PT Start Time 0715   ? PT Stop Time 0755   ? PT Time Calculation (min) 40 min   ? Activity Tolerance Patient tolerated treatment well   ? Behavior [facility] for tasks assessed/performed   ? ?  ?  ? ?  ? ? ?Past Medical History:  ?Diagnosis Date  ? Anxiety   ? Asthma   ? Depression   ? Hyperlipidemia   ? ? ?Past Surgical History:  ?Procedure Laterality Date  ? OPEN REPAIR PERIARTICULAR FRACTURE / DISLOCATION ELBOW    ? TONSILLECTOMY    ? ? ?There were no vitals filed for this visit. ? ? Subjective Assessment - 11/25/21 0716   ? ? Subjective Pt saw Dr. Karie Schwalbe and pt will be getting MRI. Pt states she has been getting better at being consistent with her exercises. Pt reports her mid back has been an issue in the mornings. Pt states doorframe and book stretches.   ? Limitations Standing;Walking   ? How long can you sit comfortably? No issues but can feel it after a couple of hours   ? How long can you stand comfortably? ~2 hours before pain becomes stabbing   ? How long can you walk comfortably? ~1 mile can feel discomfort   ? Patient Stated Goals Exercises to decrease pain   ? Currently in Pain? No/denies   ? Pain Onset More than a month ago   ? ?  ?  ? ?  ? ? ? ? ? [facility] PT Assessment - 11/25/21 0001   ? ?  ? Assessment  ? Medical Diagnosis M47.816 (ICD-10-CM) - Lumbar spondylosis   ? Referring Provider (PT) Monica Bectonhekkekandam, Thomas J, MD   ? ?  ?  ? ?  ? ? ? ? ? ? ? ? ? ? ? ? ? ? ? ? [facility] Adult PT  Treatment/Exercise - 11/25/21 0001   ? ?  ? Lumbar Exercises: Stretches  ? Figure 4 Stretch 30 seconds;Supine;1 rep   ? Other Lumbar Stretch Exercise open/close book 10x5 sec   ? Other Lumbar Stretch Exercise trunk rotation 10x5 sec   ?  ? Lumbar Exercises: Aerobic  ? Tread Mill 5 min 1.9 mph   ?  ? Lumbar Exercises: Standing  ? Other Standing Lumbar Exercises bow and arrow 10x2 sec blue tband   ? Other Standing Lumbar Exercises lateral and backward walk x10 green tband   ?  ? Lumbar Exercises: Supine  ? Bent Knee Raise 20 reps   ? Bent Knee Raise Limitations alternating keeping knees raised   ? Dead Bug 20 reps   ? Dead Bug Limitations maintaining knees raised, alternating arm flexion   ?  ? Manual Therapy  ? Joint Mobilization grade 3 UPAs and CPAs thoracic & lumbar   ? Soft tissue mobilization STM & TPR upper lumbar and lower thoracic paraspinals   ? ?  ?  ? ?  ? ? ? Trigger Point  Dry Needling - 11/25/21 0001   ? ? Consent Given? Yes   ? Education Handout Provided No   ? Muscles Treated Back/Hip Thoracic multifidi;Erector spinae   ? Erector spinae Response Twitch response elicited;Palpable increased muscle length   ? Thoracic multifidi response Twitch response elicited;Palpable increased muscle length   ? ?  ?  ? ?  ? ? ? ? ? ? ? ? ? ? ? ? ? PT Long Term Goals - 10/21/21 0716   ? ?  ? PT LONG TERM GOAL #1  ? Title Pt will be independent with her HEP and progressions   ? Time 6   ? Period Weeks   ? Status New   ? Target Date 12/02/21   ?  ? PT LONG TERM GOAL #2  ? Title Pt will demo increased bilat hip strength to at least 4+/5   ? Time 6   ? Period Weeks   ? Status New   ? Target Date 12/02/21   ?  ? PT LONG TERM GOAL #3  ? Title Pt will have R = L hamstring flexibility to demo decreased muscle imbalance   ? Time 6   ? Period Weeks   ? Status New   ? Target Date 12/02/21   ?  ? PT LONG TERM GOAL #4  ? 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 Time 6   ? Period  Weeks   ? Status New   ? Target Date 12/02/21   ?  ? PT LONG TERM GOAL #5  ? Title Pt will have improved FOTO score to 74   ? Time 6   ? Period Weeks   ? Status New   ? Target Date 12/02/21   ? ?  ?  ? ?  ? ? ? ? ? ? ? ? Plan - 11/25/21 0738   ? ? Clinical Impression Statement Less R hip/low back pain this session. Increased tightness and spasm along upper lumbar and lower thoracic. Pt demonstrating some trunk compensation when performing her standing hip abduction and extensions -- modified to side stepping and backwards stepping with improved activation of glutes over lumbar spine. Progressed core strengthening.   ? Personal Factors and Comorbidities Age;Fitness;Time since onset of injury/illness/exacerbation;Profession   ? Examination-Activity Limitations Locomotion Level;Sit;Squat;Stand;Transfers   ? Examination-Participation Restrictions Meal Prep;Cleaning;Community Activity;Occupation   ? Rehab Potential Good   ? PT Frequency 2x / week   ? PT Duration 6 weeks   ? PT Treatment/Interventions ADLs/Self Care Home Management;Aquatic Therapy;Cryotherapy;Electrical Stimulation;Iontophoresis 4mg /ml Dexamethasone;Moist Heat;Traction;Gait training;Stair training;Functional mobility training;Therapeutic exercise;Therapeutic activities;Neuromuscular re-education;Balance training;Manual techniques;Patient/family education;Passive range of motion;Dry needling;Taping   ? PT Next Visit Plan progress HEP, core strengthening and stretching   ? PT Home Exercise Plan   ? Consulted and Agree with Plan of Care Patient   ? ?  ?  ? ?  ? ? ?Patient will benefit from skilled therapeutic intervention in order to improve the following deficits and impairments:  Decreased range of motion, Difficulty walking, Increased fascial restricitons, Increased muscle spasms, Pain, Improper body mechanics, Decreased mobility, Decreased strength, Postural dysfunction, Impaired flexibility, Hypomobility ? ?Visit Diagnosis: ?Muscle weakness  (generalized) ? ?Other abnormalities of gait and mobility ? ?Pain in right hip ? ?Stiffness of right hip, not elsewhere classified ? ? ? ? ?Problem List ?Patient Active Problem List  ? Diagnosis Date Noted  ? Lumbar spondylosis 10/13/2021  ?  Anxiety 12/25/2020  ? GERD (gastroesophageal reflux disease) 12/25/2020  ? Hyperlipidemia 12/25/2020  ? History of iron deficiency anemia 12/25/2020  ? Low vitamin D level 12/25/2020  ? Metabolic syndrome 12/25/2020  ? Mild persistent asthma 12/25/2020  ? Morbid obesity (HCC) 12/25/2020  ? Prediabetes 12/25/2020  ? ? ?Oriel Rumbold April Dell Ponto, PT, DPT ?11/25/2021, 1:17 PM ? ?[place] ?[facility] ?[address] ?[place], [place], [address] ?Phone: [phone]   Fax:  [phone] ? ?Name: Shawna Barnett ?MRN: [id number] ?Date of Birth: 08/10/1985 ? ? ? ?

## 2021-11-29 ENCOUNTER — Ambulatory Visit (INDEPENDENT_AMBULATORY_CARE_PROVIDER_SITE_OTHER): Payer: 59

## 2021-11-29 DIAGNOSIS — M545 Low back pain, unspecified: Secondary | ICD-10-CM

## 2021-11-29 DIAGNOSIS — M25559 Pain in unspecified hip: Secondary | ICD-10-CM | POA: Diagnosis not present

## 2021-11-29 DIAGNOSIS — M47816 Spondylosis without myelopathy or radiculopathy, lumbar region: Secondary | ICD-10-CM | POA: Diagnosis not present

## 2021-12-02 ENCOUNTER — Ambulatory Visit: Payer: 59 | Attending: Sports Medicine | Admitting: Physical Therapy

## 2021-12-02 DIAGNOSIS — M25651 Stiffness of right hip, not elsewhere classified: Secondary | ICD-10-CM | POA: Diagnosis present

## 2021-12-02 DIAGNOSIS — R2689 Other abnormalities of gait and mobility: Secondary | ICD-10-CM | POA: Diagnosis present

## 2021-12-02 DIAGNOSIS — M6281 Muscle weakness (generalized): Secondary | ICD-10-CM | POA: Diagnosis present

## 2021-12-02 DIAGNOSIS — M25551 Pain in right hip: Secondary | ICD-10-CM | POA: Diagnosis present

## 2021-12-02 NOTE — Therapy (Signed)
[place] [facility] ?[address] ?[place], [place], [address] ?Phone: [phone]   Fax:  [phone] ? ?Physical Therapy Treatment and Discharge ? ?Patient Details  ?Name: Shawna Barnett ?MRN: [id number] ?Date of Birth: 1985-10-29 ?Referring Provider (PT): Silverio Decamp, MD ? ?PHYSICAL THERAPY DISCHARGE SUMMARY ? ?Visits from Start of Care: 10 ? ?Current functional level related to goals / functional outcomes: ?See below ?  ?Remaining deficits: ?See below ?  ?Education / Equipment: ?See below  ? ?Patient agrees to discharge. Patient goals were met. Patient is being discharged due to meeting the stated rehab goals. ? ? ? ?Encounter Date: 12/02/2021 ? ? PT End of Session - 12/02/21 6283   ? ? Visit Number 10   ? Number of Visits 12   ? Date for PT Re-Evaluation 12/02/21   ? Authorization Type Aetna   ? PT Start Time 0715   ? PT Stop Time 0755   ? PT Time Calculation (min) 40 min   ? Activity Tolerance Patient tolerated treatment well   ? Behavior During Therapy [facility] for tasks assessed/performed   ? ?  ?  ? ?  ? ? ?Past Medical History:  ?Diagnosis Date  ? Anxiety   ? Asthma   ? Depression   ? Hyperlipidemia   ? ? ?Past Surgical History:  ?Procedure Laterality Date  ? OPEN REPAIR PERIARTICULAR FRACTURE / DISLOCATION ELBOW    ? TONSILLECTOMY    ? ? ?There were no vitals filed for this visit. ? ? Subjective Assessment - 12/02/21 0717   ? ? Subjective MRI came back alright. Pt states her back was fine but has noticed that when it gets cold her back will spasm and tighten up. Got her massage and has been going easy. Hip has not hurt. Plans to have injection from Dr. Darene Lamer.   ? Limitations Standing;Walking   ? How long can you sit comfortably? No issues but can feel it after a couple of hours   ? How long can you stand comfortably? ~2 hours before pain becomes stabbing   ? How long can you walk comfortably? ~1 mile can feel discomfort   ? Patient Stated Goals  Exercises to decrease pain   ? Currently in Pain? Yes   ? Pain Score 2    ? Pain Location Back   ? Pain Orientation Mid   ? Pain Descriptors / Indicators Tightness   ? Pain Onset More than a month ago   ? ?  ?  ? ?  ? ? ? ? ? [facility] PT Assessment - 12/02/21 0001   ? ?  ? Assessment  ? Medical Diagnosis M47.816 (ICD-10-CM) - Lumbar spondylosis   ? Referring Provider (PT) Silverio Decamp, MD   ?  ? Observation/Other Assessments  ? Focus on Therapeutic Outcomes (FOTO)  76   ?  ? Strength  ? Right Hip Flexion 5/5   ? Right Hip Extension 4+/5   ? Right Hip External Rotation  4+/5   ? Right Hip Internal Rotation 4+/5   ? Right Hip ABduction 5/5   ? Right Hip ADduction 4+/5   ? Left Hip Flexion 5/5   ? Left Hip Extension 4+/5   ? Left Hip External Rotation 4+/5   ? Left Hip Internal Rotation 4+/5   ? Left Hip ABduction 5/5   ? Left Hip ADduction 4+/5   ? ?  ?  ? ?  ? ? ? ? ? ? ? ? ? ? ? ? ? ? ? ?  [facility] Adult PT Treatment/Exercise - 12/02/21 0001   ? ?  ? Manual Therapy  ? Joint Mobilization grade 3 UPAs and CPAs thoracic & lumbar   ? Soft tissue mobilization STM & TPR upper lumbar and lower thoracic paraspinals, R UT   ? ?  ?  ? ?  ? ? ? ? ? ? ? ? ? ? PT Education - 12/02/21 0803   ? ? Education Details HEP progressions, theracane and self massage, d/c   ? Person(s) Educated Patient   ? Methods Explanation;Demonstration;Tactile cues;Verbal cues;Handout   ? Comprehension Verbalized understanding;Returned demonstration;Verbal cues required;Tactile cues required   ? ?  ?  ? ?  ? ? ? ? ? ? PT Long Term Goals - 12/02/21 0747   ? ?  ? PT LONG TERM GOAL #1  ? Title Pt will be independent with her HEP and progressions   ? Time 6   ? Period Weeks   ? Status Achieved   ? Target Date 12/02/21   ?  ? PT LONG TERM GOAL #2  ? Title Pt will demo increased bilat hip strength to at least 4+/5   ? Time 6   ? Period Weeks   ? Status Achieved   ? Target Date 12/02/21   ?  ? PT LONG TERM GOAL #3  ? Title Pt will have R = L hamstring  flexibility to demo decreased muscle imbalance   ? Time 6   ? Period Weeks   ? Status Achieved   ? Target Date 12/02/21   ?  ? PT LONG TERM GOAL #4  ? 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 Baseline 2 or 3/10 later in the day   ? Time 6   ? Period Weeks   ? Status Partially Met   ? Target Date 12/02/21   ?  ? PT LONG TERM GOAL #5  ? Title Pt will have improved FOTO score to 74   ? Baseline 76   ? Time 6   ? Period Weeks   ? Status Achieved   ? Target Date 12/02/21   ? ?  ?  ? ?  ? ? ? ? ? ? ? ? Plan - 12/02/21 0800   ? ? Clinical Impression Statement Session focused on PT discharge. Went over [employer] and discussed progressions for home. Pt has met or partially met all of her LTGs. She is ready for d/c at this time.   ? Personal Factors and Comorbidities Age;Fitness;Time since onset of injury/illness/exacerbation;Profession   ? Examination-Activity Limitations Locomotion Level;Sit;Squat;Stand;Transfers   ? Examination-Participation Restrictions Meal Prep;Cleaning;Community Activity;Occupation   ? Rehab Potential Good   ? PT Frequency 2x / week   ? PT Duration 6 weeks   ? PT Treatment/Interventions ADLs/Self Care Home Management;Aquatic Therapy;Cryotherapy;Electrical Stimulation;Iontophoresis 54m/ml Dexamethasone;Moist Heat;Traction;Gait training;Stair training;Functional mobility training;Therapeutic exercise;Therapeutic activities;Neuromuscular re-education;Balance training;Manual techniques;Patient/family education;Passive range of motion;Dry needling;Taping   ? PT Next Visit Plan progress HEP, core strengthening and stretching   ? PT HUnity  ? Consulted and Agree with Plan of Care Patient   ? ?  ?  ? ?  ? ? ?Patient will benefit from skilled therapeutic intervention in order to improve the following deficits and impairments:  Decreased range of motion, Difficulty walking, Increased fascial restricitons, Increased muscle spasms, Pain, Improper  body mechanics, Decreased mobility, Decreased strength, Postural dysfunction, Impaired flexibility, Hypomobility ? ?Visit  Diagnosis: ?Muscle weakness (generalized) ? ?Other abnormalities of gait and mobility ? ?Pain in right hip ? ?Stiffness of right hip, not elsewhere classified ? ? ? ? ?Problem List ?Patient Active Problem List  ? Diagnosis Date Noted  ? Lumbar spondylosis 10/13/2021  ? Anxiety 12/25/2020  ? GERD (gastroesophageal reflux disease) 12/25/2020  ? Hyperlipidemia 12/25/2020  ? History of iron deficiency anemia 12/25/2020  ? Low vitamin D level 12/25/2020  ? Metabolic syndrome 71/95/9747  ? Mild persistent asthma 12/25/2020  ? Morbid obesity ([place]) 12/25/2020  ? Prediabetes 12/25/2020  ? ? ?Amandine Covino April Gordy Levan, PT, DPT ?12/02/2021, 8:47 AM ? ?[place] [facility] ?[address] ?[place], [place], [address] ?Phone: [phone]   Fax:  [phone] ? ?Name: Shawna Barnett ?MRN: [id number] ?Date of Birth: 1985-09-21 ? ? ? ?

## 2021-12-07 ENCOUNTER — Ambulatory Visit: Payer: 59 | Admitting: Physical Therapy

## 2021-12-08 ENCOUNTER — Ambulatory Visit: Payer: 59 | Admitting: Sports Medicine

## 2021-12-08 ENCOUNTER — Ambulatory Visit (INDEPENDENT_AMBULATORY_CARE_PROVIDER_SITE_OTHER): Payer: 59

## 2021-12-08 DIAGNOSIS — M47816 Spondylosis without myelopathy or radiculopathy, lumbar region: Secondary | ICD-10-CM

## 2021-12-08 NOTE — Assessment & Plan Note (Signed)
Pleasant 36 year old female, has lost over 60 pounds on Wegovy, tolerating 2.4 mg well, she did have axial right-sided low back pain near the SI joint, surprisingly lumbar spine MRI was completely unremarkable even on my personal review, today we did a right sacroiliac joint injection. ?Return to see me in a month. ?

## 2021-12-08 NOTE — Progress Notes (Signed)
? ? ?  Procedures performed today:   ? ?Procedure: Real-time Ultrasound Guided injection of the right sacroiliac joint ?Device: Samsung HS60  ?Verbal informed consent obtained.  ?Time-out conducted.  ?Noted no overlying erythema, induration, or other signs of local infection.  ?Skin prepped in a sterile fashion.  ?Local anesthesia: Topical Ethyl chloride.  ?With sterile technique and under real time ultrasound guidance: Noted normal-appearing SI joint, 1 cc Kenalog 40, 2 cc lidocaine, 2 cc bupivacaine injected easily ?Completed without difficulty  ?Advised to call if fevers/chills, erythema, induration, drainage, or persistent bleeding.  ?Images permanently stored and available for review in PACS.  ?Impression: Technically successful ultrasound guided injection. ? ?Independent interpretation of notes and tests performed by another provider:  ? ?None. ? ?Brief History, Exam, Impression, and Recommendations:   ? ?Lumbar spondylosis ?Pleasant 36 year old female, has lost over 60 pounds on Wegovy, tolerating 2.4 mg well, she did have axial right-sided low back pain near the SI joint, surprisingly lumbar spine MRI was completely unremarkable even on my personal review, today we did a right sacroiliac joint injection. ?Return to see me in a month. ? ?Chronic process with exacerbation and pharmacologic intervention ? ?___________________________________________ ?Ihor Austin. Benjamin Stain, M.D., ABFM., CAQSM. ?Primary Care and Sports Medicine ?[facility] ? ?Adjunct Instructor of Family Medicine  ?[employer] ?

## 2021-12-09 ENCOUNTER — Encounter: Payer: 59 | Admitting: Physical Therapy

## 2021-12-18 ENCOUNTER — Other Ambulatory Visit: Payer: Self-pay | Admitting: Medical-Surgical

## 2022-01-06 ENCOUNTER — Ambulatory Visit: Payer: 59 | Admitting: Sports Medicine

## 2022-01-06 DIAGNOSIS — M47816 Spondylosis without myelopathy or radiculopathy, lumbar region: Secondary | ICD-10-CM | POA: Diagnosis not present

## 2022-01-06 NOTE — Assessment & Plan Note (Signed)
Pleasant 36-year female returns, we did a right sacroiliac joint at the last visit, she had concordant pain during the injection and good resolution of both her low back as well as buttock pain on the right immediately after the injection. She had a few weeks of near complete pain relief. She now has recurrence of pain, not over the SI joint but further down deeper in the buttock. I am not able to reproduce it on exam. Hip motion is good, no pain over the greater trochanter, no pain at the ischial tuberosity. Mostly deep in the piriformis. I do suspect this is a recurrence of pain from her SI joint. I will certainly try to ensure she does not have a pelvic stress injury so we will proceed with hip/pelvic MRI. If insufficient improvement we will get Dr. Laurian Brim to weigh in with regards to ablation of the SI joint. If all else fails we will treat this as more of a myofascial/fibromyalgia type picture.

## 2022-01-06 NOTE — Progress Notes (Signed)
    Procedures performed today:    None.  Independent interpretation of notes and tests performed by another provider:   None.  Brief History, Exam, Impression, and Recommendations:    Lumbar spondylosis Pleasant 36-year female returns, we did a right sacroiliac joint at the last visit, she had concordant pain during the injection and good resolution of both her low back as well as buttock pain on the right immediately after the injection. She had a few weeks of near complete pain relief. She now has recurrence of pain, not over the SI joint but further down deeper in the buttock. I am not able to reproduce it on exam. Hip motion is good, no pain over the greater trochanter, no pain at the ischial tuberosity. Mostly deep in the piriformis. I do suspect this is a recurrence of pain from her SI joint. I will certainly try to ensure she does not have a pelvic stress injury so we will proceed with hip/pelvic MRI. If insufficient improvement we will get Dr. Laurian Brim to weigh in with regards to ablation of the SI joint. If all else fails we will treat this as more of a myofascial/fibromyalgia type picture.    ___________________________________________ Ihor Austin. Benjamin Stain, M.D., ABFM., CAQSM. Primary Care and Sports Medicine [facility]  Adjunct Instructor of Family Medicine  [employer]

## 2022-01-12 ENCOUNTER — Ambulatory Visit (INDEPENDENT_AMBULATORY_CARE_PROVIDER_SITE_OTHER): Payer: 59

## 2022-01-12 DIAGNOSIS — M47816 Spondylosis without myelopathy or radiculopathy, lumbar region: Secondary | ICD-10-CM

## 2022-01-23 ENCOUNTER — Encounter: Payer: Self-pay | Admitting: Medical-Surgical

## 2022-01-23 ENCOUNTER — Encounter: Payer: Self-pay | Admitting: Sports Medicine

## 2022-01-23 DIAGNOSIS — M47816 Spondylosis without myelopathy or radiculopathy, lumbar region: Secondary | ICD-10-CM

## 2022-02-05 ENCOUNTER — Other Ambulatory Visit: Payer: Self-pay | Admitting: Sports Medicine

## 2022-02-05 DIAGNOSIS — M47816 Spondylosis without myelopathy or radiculopathy, lumbar region: Secondary | ICD-10-CM

## 2022-04-05 ENCOUNTER — Other Ambulatory Visit: Payer: Self-pay | Admitting: Medical-Surgical

## 2022-04-07 ENCOUNTER — Encounter: Payer: Self-pay | Admitting: Medical-Surgical

## 2022-04-08 MED ORDER — ALBUTEROL SULFATE HFA 108 (90 BASE) MCG/ACT IN AERS: 2.0000 | INHALATION_SPRAY | RESPIRATORY_TRACT | 2 refills | Status: DC | PRN

## 2022-04-12 ENCOUNTER — Ambulatory Visit (INDEPENDENT_AMBULATORY_CARE_PROVIDER_SITE_OTHER): Payer: 59

## 2022-04-12 ENCOUNTER — Ambulatory Visit: Payer: 59 | Admitting: Sports Medicine

## 2022-04-12 DIAGNOSIS — M47816 Spondylosis without myelopathy or radiculopathy, lumbar region: Secondary | ICD-10-CM

## 2022-04-12 NOTE — Progress Notes (Signed)
    Procedures performed today:    Procedure: Real-time Ultrasound Guided injection of the right sacroiliac joint Device: Samsung HS60  Verbal informed consent obtained.  Time-out conducted.  Noted no overlying erythema, induration, or other signs of local infection.  Skin prepped in a sterile fashion.  Local anesthesia: Topical Ethyl chloride.  With sterile technique and under real time ultrasound guidance: Noted normal-appearing joint, 1 cc Kenalog 40, 2 cc lidocaine, 2 cc bupivacaine injected easily Completed without difficulty  Advised to call if fevers/chills, erythema, induration, drainage, or persistent bleeding.  Images permanently stored and available for review in PACS.  Impression: Technically successful ultrasound guided injection.  Independent interpretation of notes and tests performed by another provider:   None.  Brief History, Exam, Impression, and Recommendations:    Lumbar spondylosis Shawna Barnett returns, she is a very pleasant 36 year old female, chronic axial low back pain, we did a right sacroiliac joint injection back in May, she had concordant pain during the procedure and did really well for several weeks, she had a recurrence of pain so we did refer her back to Dr. Laurian Brim for consideration of ablation which unfortunately her insurance did not cover. He did offer a second SI joint steroid injection, she is going to have me do this today. In the meantime she understands if the pain comes back we can start gabapentin, she is also considering pain for the ablation out-of-pocket. We did discuss surgical intervention but also explained that this should be absolute last resort, return to see me in 6 weeks as needed.    ____________________________________________ Ihor Austin. Benjamin Stain, M.D., ABFM., CAQSM., AME. Primary Care and Sports Medicine [facility]  Adjunct Professor of Family Medicine  [place] [employer]  [employer]  Restaurant manager, fast food

## 2022-04-12 NOTE — Assessment & Plan Note (Signed)
Shawna Barnett returns, she is a very pleasant 36 year old female, chronic axial low back pain, we did a right sacroiliac joint injection back in May, she had concordant pain during the procedure and did really well for several weeks, she had a recurrence of pain so we did refer her back to Dr. Laurian Brim for consideration of ablation which unfortunately her insurance did not cover. He did offer a second SI joint steroid injection, she is going to have me do this today. In the meantime she understands if the pain comes back we can start gabapentin, she is also considering pain for the ablation out-of-pocket. We did discuss surgical intervention but also explained that this should be absolute last resort, return to see me in 6 weeks as needed.

## 2022-04-13 NOTE — Telephone Encounter (Signed)
Patient called and scheduled for appointment regarding Wegovy refill. Katha Hamming

## 2022-04-13 NOTE — Telephone Encounter (Signed)
Patient needs an appointment for further refills.  Last office visit 09/28/2021  Last filled 10/13/2021  Sent 30 day supply to the pharmacy. No refill.

## 2022-04-30 ENCOUNTER — Other Ambulatory Visit: Payer: Self-pay | Admitting: Medical-Surgical

## 2022-05-04 ENCOUNTER — Other Ambulatory Visit: Payer: Self-pay | Admitting: Medical-Surgical

## 2022-05-12 ENCOUNTER — Encounter: Payer: Self-pay | Admitting: Medical-Surgical

## 2022-05-19 ENCOUNTER — Encounter: Payer: Self-pay | Admitting: Medical-Surgical

## 2022-05-19 ENCOUNTER — Other Ambulatory Visit: Payer: Self-pay | Admitting: Medical-Surgical

## 2022-05-19 MED ORDER — BECLOMETHASONE DIPROP HFA 40 MCG/ACT IN AERB: 2.0000 | INHALATION_SPRAY | Freq: Two times a day (BID) | RESPIRATORY_TRACT | 5 refills | Status: DC

## 2022-05-19 NOTE — Telephone Encounter (Signed)
Pulmicort is on backorder.  Patient request to send in Qvar.  Qvar sent to the pharmacy however this does not look like it is covered under insurance.  If this is the case, we will need to know what alternatives are covered under her policy and I will be glad to send something else in.  ___________________________________________ Clearnce Sorrel, DNP, APRN, FNP-BC Primary Care and [place]

## 2022-06-01 ENCOUNTER — Telehealth: Payer: Self-pay

## 2022-06-01 ENCOUNTER — Ambulatory Visit: Payer: 59 | Admitting: Medical-Surgical

## 2022-06-01 ENCOUNTER — Encounter: Payer: Self-pay | Admitting: Medical-Surgical

## 2022-06-01 VITALS — BP 112/70 | HR 85 | Resp 20 | Ht 67.5 in | Wt 237.8 lb

## 2022-06-01 DIAGNOSIS — N62 Hypertrophy of breast: Secondary | ICD-10-CM | POA: Diagnosis not present

## 2022-06-01 DIAGNOSIS — M6740 Ganglion, unspecified site: Secondary | ICD-10-CM

## 2022-06-01 DIAGNOSIS — J45909 Unspecified asthma, uncomplicated: Secondary | ICD-10-CM | POA: Diagnosis not present

## 2022-06-01 DIAGNOSIS — Z7689 Persons encountering health services in other specified circumstances: Secondary | ICD-10-CM

## 2022-06-01 MED ORDER — WEGOVY 2.4 MG/0.75ML ~~LOC~~ SOAJ: 2.4000 mg | SUBCUTANEOUS | 1 refills | Status: DC

## 2022-06-01 NOTE — Telephone Encounter (Signed)
Initiated Prior authorization HQP:RFFMBW 2.4MG /0.75ML auto-injectors Via: Covermymeds Case/Key:[id number] Status: Pending as of 10/311 Reason: Notified Pt via: Mychart

## 2022-06-01 NOTE — Progress Notes (Signed)
Established Patient Office Visit  Subjective   Patient ID: Shawna Barnett, female   DOB: 06/30/1986 Age: 36 y.o. MRN: [id number]   Chief Complaint  Patient presents with   Medication Refill   HPI Pleasant 36 year old female presenting today for the following:  Weight loss: She has been taking Wegovy 2.4 mg weekly, tolerating well without side effects.  No longer has issues with nausea or constipation.  He is using MiraLAX approximately twice weekly to help prevent constipation which seems to be working very well for her.  Continues to exercise regularly and has been making changes in her diet.  Notes that she has a slow loser but she is happy with her progress overall.  Macromastia: She has always been large breasted and suffers from chronic neck/shoulder/upper back pain.  Now that she has lost weight, she is interested in discussing breast reduction.  Asthma: Notes that her Pulmicort inhaler is on backorder and has been unavailable at the pharmacy.  Per pharmacy recommendations, Qvar was sent in but this is also not available.  She has had to increase her use of albuterol since she has been out of the ICS.  Interested in finding what options are available.  While she was on vacation in [place], she developed a ganglion cyst to the right wrist just over the radial artery.  Notes that it was much larger but she has been using compression on it which seems to have made it smaller.  Unfortunately, it is still very painful and limits the motion of her hand/wrist.  She is interested in getting this evaluated and treated.   Objective:    Vitals:   06/01/22 1513  BP: 112/70  Pulse: 85  Resp: 20  Height: 5' 7.5" (1.715 m)  Weight: 237 lb 12.8 oz (107.9 kg)  SpO2: 98%  BMI (Calculated): 36.67    Physical Exam Vitals and nursing note reviewed.  Constitutional:      General: She is not in acute distress.    Appearance: Normal appearance. She is obese. She is not ill-appearing.   HENT:     Head: Normocephalic and atraumatic.  Cardiovascular:     Rate and Rhythm: Normal rate and regular rhythm.     Pulses: Normal pulses.     Heart sounds: Normal heart sounds.  Pulmonary:     Effort: Pulmonary effort is normal. No respiratory distress.     Breath sounds: Normal breath sounds. No wheezing, rhonchi or rales.  Musculoskeletal:     Comments: Right wrist ganglion cyst  Skin:    General: Skin is warm and dry.  Neurological:     Mental Status: She is alert and oriented to person, place, and time.  Psychiatric:        Mood and Affect: Mood normal.        Behavior: Behavior normal.        Thought Content: Thought content normal.        Judgment: Judgment normal.   No results found for this or any previous visit (from the past 24 hour(s)).     The ASCVD Risk score (Arnett DK, et al., 2019) failed to calculate for the following reasons:   The 2019 ASCVD risk score is only valid for ages 23 to 55   Assessment & Plan:   1. Encounter for weight management Nearly 50 pounds weight loss over the past year and a half.  Doing very well on dietary modifications and regular intentional exercise.  Continue with OB 2.5  mg weekly.  2. Macromastia Referring to plastic surgery to discuss bilateral breast reduction. - Ambulatory referral to Plastic Surgery  3. Uncomplicated asthma, unspecified asthma severity, unspecified whether persistent Unfortunately, does not look like that there are many options that are covered under her insurance plan when trying to order in our system.  Advised her to call the pharmacies in the nearby area to see if they have either Pulmicort or Qvar on hand.  If so, we will be glad to send it to wherever she needs.  If not, how the pharmacies run her insurance to see what alternatives are covered and then let me know.  4. Ganglion cyst Recommend follow-up with Dr. Karie Schwalbe for bedside ultrasound evaluation and possible aspiration.  Return in about 6 months  (around 11/30/2022) for weight check.  ___________________________________________ Thayer Ohm, DNP, APRN, FNP-BC Primary Care and Sports Medicine [facility]

## 2022-06-04 ENCOUNTER — Encounter: Payer: Self-pay | Admitting: Medical-Surgical

## 2022-06-04 ENCOUNTER — Other Ambulatory Visit: Payer: Self-pay

## 2022-06-04 MED ORDER — BECLOMETHASONE DIPROP HFA 40 MCG/ACT IN AERB: 2.0000 | INHALATION_SPRAY | Freq: Two times a day (BID) | RESPIRATORY_TRACT | 5 refills | Status: DC

## 2022-06-04 MED ORDER — FLUTICASONE-SALMETEROL 100-50 MCG/ACT IN AEPB: 1.0000 | INHALATION_SPRAY | Freq: Two times a day (BID) | RESPIRATORY_TRACT | 3 refills | Status: DC

## 2022-06-08 ENCOUNTER — Ambulatory Visit: Payer: 59 | Admitting: Sports Medicine

## 2022-06-14 ENCOUNTER — Other Ambulatory Visit: Payer: Self-pay | Admitting: Sports Medicine

## 2022-06-14 DIAGNOSIS — M47816 Spondylosis without myelopathy or radiculopathy, lumbar region: Secondary | ICD-10-CM

## 2022-06-15 ENCOUNTER — Other Ambulatory Visit: Payer: Self-pay | Admitting: Medical-Surgical

## 2022-07-01 ENCOUNTER — Other Ambulatory Visit: Payer: Self-pay | Admitting: Medical-Surgical

## 2022-07-02 ENCOUNTER — Other Ambulatory Visit: Payer: Self-pay | Admitting: Medical-Surgical

## 2022-07-27 ENCOUNTER — Other Ambulatory Visit: Payer: Self-pay | Admitting: Medical-Surgical

## 2022-08-02 HISTORY — PX: BREAST REDUCTION SURGERY: SHX8

## 2022-08-03 ENCOUNTER — Encounter (INDEPENDENT_AMBULATORY_CARE_PROVIDER_SITE_OTHER): Payer: 59 | Admitting: Medical-Surgical

## 2022-08-03 DIAGNOSIS — F419 Anxiety disorder, unspecified: Secondary | ICD-10-CM

## 2022-08-04 MED ORDER — ESCITALOPRAM OXALATE 10 MG PO TABS: 10.0000 mg | ORAL_TABLET | Freq: Every day | ORAL | 1 refills | Status: DC

## 2022-08-04 NOTE — Telephone Encounter (Signed)

## 2022-08-16 ENCOUNTER — Other Ambulatory Visit: Payer: Self-pay | Admitting: Medical-Surgical

## 2022-08-24 ENCOUNTER — Encounter: Payer: Self-pay | Admitting: Sports Medicine

## 2022-08-24 ENCOUNTER — Encounter: Payer: Self-pay | Admitting: Medical-Surgical

## 2022-08-24 DIAGNOSIS — N62 Hypertrophy of breast: Secondary | ICD-10-CM | POA: Insufficient documentation

## 2022-08-24 NOTE — Telephone Encounter (Signed)
Letter written, please print and fax to her plastic surgeon

## 2022-08-24 NOTE — Telephone Encounter (Signed)
Faxed letter to [employer] [phone]

## 2022-09-03 ENCOUNTER — Encounter: Payer: Self-pay | Admitting: Medical-Surgical

## 2022-09-03 ENCOUNTER — Telehealth (INDEPENDENT_AMBULATORY_CARE_PROVIDER_SITE_OTHER): Payer: 59 | Admitting: Medical-Surgical

## 2022-09-03 DIAGNOSIS — F419 Anxiety disorder, unspecified: Secondary | ICD-10-CM | POA: Diagnosis not present

## 2022-09-03 NOTE — Progress Notes (Signed)
Virtual Visit via Video Note  I connected with Shawna Barnett on 09/03/22 at  3:00 PM EST by a video enabled telemedicine application and verified that I am speaking with the correct person using two identifiers.   I discussed the limitations of evaluation and management by telemedicine and the availability of in person appointments. The patient expressed understanding and agreed to proceed.  Patient location: home Provider locations: office  Subjective:    CC: Mood follow-up  HPI: Pleasant 37 year old female presenting via [place] video visit to follow-up on mood.  She has been taking Lexapro 10 mg daily for about 4 weeks and notes that this is doing very well for her symptoms.  When she has increased anxiety, she has difficulty focusing.  With the new dose of Lexapro, she has been dealing with stress and anxiety much better and notes that her focus is much improved.  Notes that she has not had to use Xanax in about 6 to 8 months.  Denies SI/HI.  Past medical history, Surgical history, Family history not pertinant except as noted below, Social history, Allergies, and medications have been entered into the medical record, reviewed, and corrections made.   Review of Systems: See HPI for pertinent positives and negatives.   Objective:    General: Speaking clearly in complete sentences without any shortness of breath.  Alert and oriented x3.  Normal judgment. No apparent acute distress.  Impression and Recommendations:    1. Anxiety Symptoms well-controlled and stable on new dose.  Continue Lexapro 10 mg daily.  Continue to limit Xanax only for severe anxiety.  I discussed the assessment and treatment plan with the patient. The patient was provided an opportunity to ask questions and all were answered. The patient agreed with the plan and demonstrated an understanding of the instructions.   The patient was advised to call back or seek an in-person evaluation if the symptoms  worsen or if the condition fails to improve as anticipated.  25 minutes of non-face-to-face time was provided during this encounter.  Return for Annual physical exam as scheduled in April.  Clearnce Sorrel, DNP, APRN, FNP-BC [place] [facility] and [facility]

## 2022-09-05 ENCOUNTER — Encounter (INDEPENDENT_AMBULATORY_CARE_PROVIDER_SITE_OTHER): Payer: 59 | Admitting: Sports Medicine

## 2022-09-05 DIAGNOSIS — M47816 Spondylosis without myelopathy or radiculopathy, lumbar region: Secondary | ICD-10-CM

## 2022-09-07 MED ORDER — GABAPENTIN 300 MG PO CAPS: ORAL_CAPSULE | ORAL | 3 refills | Status: DC

## 2022-09-07 NOTE — Telephone Encounter (Signed)
I spent 5 total minutes of online digital evaluation and management services in this patient-initiated request for online care. 

## 2022-11-13 ENCOUNTER — Other Ambulatory Visit: Payer: Self-pay | Admitting: Medical-Surgical

## 2022-11-14 ENCOUNTER — Other Ambulatory Visit: Payer: Self-pay | Admitting: Medical-Surgical

## 2022-11-15 ENCOUNTER — Other Ambulatory Visit: Payer: Self-pay | Admitting: Medical-Surgical

## 2022-11-15 NOTE — Telephone Encounter (Signed)
30-day supply sent to get her to her appointment on 4/30.

## 2022-11-15 NOTE — Telephone Encounter (Signed)
Pharmacy requesting med refill for Xopenex. Rx not listed in active med list.

## 2022-11-15 NOTE — Telephone Encounter (Signed)
Per pharmacy -  Product is on Backordered/Unavailable:

## 2022-11-16 NOTE — Telephone Encounter (Signed)
Per the pharmacy - Proventil inhaler rx is the alternative and in stock.

## 2022-11-30 ENCOUNTER — Telehealth (INDEPENDENT_AMBULATORY_CARE_PROVIDER_SITE_OTHER): Payer: 59 | Admitting: Medical-Surgical

## 2022-11-30 ENCOUNTER — Encounter: Payer: Self-pay | Admitting: Medical-Surgical

## 2022-11-30 VITALS — Wt 221.8 lb

## 2022-11-30 DIAGNOSIS — E661 Drug-induced obesity: Secondary | ICD-10-CM

## 2022-11-30 DIAGNOSIS — Z7689 Persons encountering health services in other specified circumstances: Secondary | ICD-10-CM

## 2022-11-30 MED ORDER — WEGOVY 2.4 MG/0.75ML ~~LOC~~ SOAJ: 2.4000 mg | SUBCUTANEOUS | 3 refills | Status: DC

## 2022-11-30 NOTE — Progress Notes (Signed)
Virtual Visit via Video Note  I connected with Shawna Barnett on 11/30/22 at 10:50 AM EDT by a video enabled telemedicine application and verified that I am speaking with the correct person using two identifiers.   I discussed the limitations of evaluation and management by telemedicine and the availability of in person appointments. The patient expressed understanding and agreed to proceed.  Patient location: home Provider locations: office  Subjective:    CC: weight check  HPI: Pleasant 37 year old female presenting via MyChart video visit to follow-up on weight management.  Has been taking Wegovy 2.4 mg weekly, tolerating well without side effects.  Since May 2022, has lost approximately 63 pounds and is very happy with her progress.  Her initial weight loss goal was less than 250 pounds.  She reached this in the middle of 2023.  Her new goal is 190 pounds.  She feels that she is making good progress.  Had a breast reduction approximately 3 weeks ago and is currently in the healing phase but once that has healed, plans to get restarted on regular intentional exercise.  Continues to work on healthy dietary behaviors and portion control.  Past medical history, Surgical history, Family history not pertinant except as noted below, Social history, Allergies, and medications have been entered into the medical record, reviewed, and corrections made.   Review of Systems: See HPI for pertinent positives and negatives.   Objective:    General: Speaking clearly in complete sentences without any shortness of breath.  Alert and oriented x3.  Normal judgment. No apparent acute distress.  Impression and Recommendations:    1. Encounter for weight management 2. 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  Not at goal.  Continue Wegovy 2.4 mg weekly.   Recommend continuing dietary modifications.  Once cleared, resume regular intentional exercise.  I discussed the assessment and treatment plan with the patient. The patient was provided an opportunity to ask questions and all were answered. The patient agreed with the plan and demonstrated an understanding of the instructions.   The patient was advised to call back or seek an in-person evaluation if the symptoms worsen or if the condition fails to improve as anticipated.  25 minutes of non-face-to-face time was provided during this encounter.  Return for annual physical exam at your convenience.  Thayer Ohm, DNP, APRN, FNP-BC [facility] and Sports Medicine

## 2022-12-13 ENCOUNTER — Other Ambulatory Visit: Payer: Self-pay | Admitting: Medical-Surgical

## 2023-01-23 ENCOUNTER — Other Ambulatory Visit: Payer: Self-pay | Admitting: Medical-Surgical

## 2023-01-23 DIAGNOSIS — F419 Anxiety disorder, unspecified: Secondary | ICD-10-CM

## 2023-01-24 ENCOUNTER — Other Ambulatory Visit: Payer: Self-pay | Admitting: Medical-Surgical

## 2023-01-25 ENCOUNTER — Encounter: Payer: Self-pay | Admitting: Medical-Surgical

## 2023-01-25 ENCOUNTER — Other Ambulatory Visit: Payer: Self-pay | Admitting: Medical-Surgical

## 2023-01-25 NOTE — Telephone Encounter (Signed)
Needs appt. Never prescribed by our office.

## 2023-01-26 NOTE — Progress Notes (Unsigned)
        Established patient visit  History, exam, impression, and plan:  No problem-specific Assessment & Plan notes found for this encounter.  Physical Exam  Procedures performed this visit: None.  No follow-ups on file.  __________________________________ Thayer Ohm, DNP, APRN, FNP-BC Primary Care and Sports Medicine [facility]

## 2023-01-27 ENCOUNTER — Ambulatory Visit (INDEPENDENT_AMBULATORY_CARE_PROVIDER_SITE_OTHER): Payer: 59 | Admitting: Medical-Surgical

## 2023-01-27 ENCOUNTER — Other Ambulatory Visit (HOSPITAL_COMMUNITY)
Admission: RE | Admit: 2023-01-27 | Discharge: 2023-01-27 | Disposition: A | Discharge status: Home / Self Care | Payer: 59 | Source: Ambulatory Visit | Attending: Medical-Surgical | Admitting: Medical-Surgical

## 2023-01-27 ENCOUNTER — Encounter: Payer: Self-pay | Admitting: Medical-Surgical

## 2023-01-27 VITALS — BP 99/65 | HR 71 | Ht 67.5 in | Wt 213.0 lb

## 2023-01-27 DIAGNOSIS — R112 Nausea with vomiting, unspecified: Secondary | ICD-10-CM

## 2023-01-27 DIAGNOSIS — R7303 Prediabetes: Secondary | ICD-10-CM

## 2023-01-27 DIAGNOSIS — Z124 Encounter for screening for malignant neoplasm of cervix: Secondary | ICD-10-CM

## 2023-01-27 DIAGNOSIS — Z113 Encounter for screening for infections with a predominantly sexual mode of transmission: Secondary | ICD-10-CM | POA: Insufficient documentation

## 2023-01-27 DIAGNOSIS — E782 Mixed hyperlipidemia: Secondary | ICD-10-CM

## 2023-01-27 DIAGNOSIS — Z3041 Encounter for surveillance of contraceptive pills: Secondary | ICD-10-CM

## 2023-01-27 DIAGNOSIS — F419 Anxiety disorder, unspecified: Secondary | ICD-10-CM

## 2023-01-27 DIAGNOSIS — D508 Other iron deficiency anemias: Secondary | ICD-10-CM

## 2023-01-27 LAB — CBC WITH DIFFERENTIAL/PLATELET
Eosinophils Relative: 0.8 %
Platelets: 343 10*3/uL (ref 140–400)
RBC: 4.74 10*6/uL (ref 3.80–5.10)

## 2023-01-27 LAB — POCT URINE PREGNANCY: Preg Test, Ur: NEGATIVE

## 2023-01-27 MED ORDER — JUNEL FE 1/20 1-20 MG-MCG PO TABS: 1.0000 | ORAL_TABLET | Freq: Every day | ORAL | 4 refills | Status: DC

## 2023-01-28 LAB — COMPLETE METABOLIC PANEL WITH GFR
AG Ratio: 1.6 (calc) (ref 1.0–2.5)
ALT: 9 U/L (ref 6–29)
AST: 15 U/L (ref 10–30)
Albumin: 4.4 g/dL (ref 3.6–5.1)
Alkaline phosphatase (APISO): 72 U/L (ref 31–125)
BUN: 14 mg/dL (ref 7–25)
CO2: 26 mmol/L (ref 20–32)
Calcium: 9.5 mg/dL (ref 8.6–10.2)
Chloride: 102 mmol/L (ref 98–110)
Creat: 0.94 mg/dL (ref 0.50–0.97)
Globulin: 2.8 g/dL (calc) (ref 1.9–3.7)
Glucose, Bld: 90 mg/dL (ref 65–99)
Potassium: 4.7 mmol/L (ref 3.5–5.3)
Sodium: 137 mmol/L (ref 135–146)
Total Bilirubin: 0.5 mg/dL (ref 0.2–1.2)
Total Protein: 7.2 g/dL (ref 6.1–8.1)
eGFR: 81 mL/min/{1.73_m2} (ref >=60)

## 2023-01-28 LAB — RPR: RPR Ser Ql: NONREACTIVE

## 2023-01-28 LAB — CBC WITH DIFFERENTIAL/PLATELET
Absolute Monocytes: 403 cells/uL (ref 200–950)
Basophils Absolute: 53 cells/uL (ref 0–200)
Basophils Relative: 0.8 %
Eosinophils Absolute: 53 cells/uL (ref 15–500)
HCT: 42.1 % (ref 35.0–45.0)
Hemoglobin: 14 g/dL (ref 11.7–15.5)
Lymphs Abs: 2488 cells/uL (ref 850–3900)
MCH: 29.5 pg (ref 27.0–33.0)
MCHC: 33.3 g/dL (ref 32.0–36.0)
MCV: 88.8 fL (ref 80.0–100.0)
MPV: 12 fL (ref 7.5–12.5)
Monocytes Relative: 6.1 %
Neutro Abs: 3604 cells/uL (ref 1500–7800)
Neutrophils Relative %: 54.6 %
RDW: 12.5 % (ref 11.0–15.0)
Total Lymphocyte: 37.7 %
WBC: 6.6 10*3/uL (ref 3.8–10.8)

## 2023-01-28 LAB — LIPID PANEL
Cholesterol: 186 mg/dL (ref <200)
HDL: 57 mg/dL (ref >=50)
LDL Cholesterol (Calc): 113 mg/dL (calc) — ABNORMAL HIGH
Non-HDL Cholesterol (Calc): 129 mg/dL (calc) (ref <130)
Total CHOL/HDL Ratio: 3.3 (calc) (ref <5.0)
Triglycerides: 74 mg/dL (ref <150)

## 2023-01-28 LAB — HEPATITIS B SURFACE ANTIGEN: Hepatitis B Surface Ag: NONREACTIVE

## 2023-01-28 LAB — IRON,TIBC AND FERRITIN PANEL
%SAT: 21 % (calc) (ref 16–45)
Ferritin: 45 ng/mL (ref 16–154)
Iron: 70 ug/dL (ref 40–190)
TIBC: 331 mcg/dL (calc) (ref 250–450)

## 2023-01-28 LAB — ALLERGEN MILK
Class: 0
Milk IgE: <0.1 kU/L

## 2023-01-28 LAB — HIV ANTIBODY (ROUTINE TESTING W REFLEX): HIV 1&2 Ab, 4th Generation: NONREACTIVE

## 2023-01-28 LAB — HEMOGLOBIN A1C
Hgb A1c MFr Bld: 5.4 % of total Hgb (ref <5.7)
Mean Plasma Glucose: 108 mg/dL
eAG (mmol/L): 6 mmol/L

## 2023-01-28 LAB — INTERPRETATION:

## 2023-01-28 LAB — HEPATITIS C ANTIBODY: Hepatitis C Ab: NONREACTIVE

## 2023-01-31 LAB — CYTOLOGY - PAP
Chlamydia: NEGATIVE
Comment: NEGATIVE
Comment: NEGATIVE
Comment: NORMAL
Diagnosis: NEGATIVE
High risk HPV: NEGATIVE
Neisseria Gonorrhea: NEGATIVE

## 2023-04-21 ENCOUNTER — Other Ambulatory Visit: Payer: Self-pay | Admitting: Medical-Surgical

## 2023-04-21 DIAGNOSIS — F419 Anxiety disorder, unspecified: Secondary | ICD-10-CM

## 2023-07-12 ENCOUNTER — Encounter: Payer: Self-pay | Admitting: Medical-Surgical

## 2023-07-12 MED ORDER — VALACYCLOVIR HCL 1 G PO TABS: 1000.0000 mg | ORAL_TABLET | Freq: Every day | ORAL | 0 refills | Status: DC | PRN

## 2023-07-23 ENCOUNTER — Other Ambulatory Visit: Payer: Self-pay | Admitting: Medical-Surgical

## 2023-07-23 DIAGNOSIS — F419 Anxiety disorder, unspecified: Secondary | ICD-10-CM

## 2023-08-04 ENCOUNTER — Other Ambulatory Visit: Payer: Self-pay | Admitting: Medical-Surgical

## 2023-08-29 ENCOUNTER — Other Ambulatory Visit: Payer: Self-pay | Admitting: Medical-Surgical

## 2023-08-29 DIAGNOSIS — F419 Anxiety disorder, unspecified: Secondary | ICD-10-CM

## 2023-08-31 ENCOUNTER — Other Ambulatory Visit: Payer: Self-pay | Admitting: Medical-Surgical

## 2023-09-01 ENCOUNTER — Other Ambulatory Visit: Payer: Self-pay | Admitting: Medical-Surgical

## 2023-09-01 DIAGNOSIS — F419 Anxiety disorder, unspecified: Secondary | ICD-10-CM

## 2023-09-07 ENCOUNTER — Encounter: Payer: Self-pay | Admitting: Medical-Surgical

## 2023-09-08 ENCOUNTER — Other Ambulatory Visit: Payer: Self-pay | Admitting: Medical-Surgical

## 2023-09-08 DIAGNOSIS — F419 Anxiety disorder, unspecified: Secondary | ICD-10-CM

## 2023-09-09 ENCOUNTER — Encounter: Payer: Self-pay | Admitting: Medical-Surgical

## 2023-09-09 ENCOUNTER — Ambulatory Visit: Payer: 59 | Admitting: Medical-Surgical

## 2023-09-09 ENCOUNTER — Other Ambulatory Visit: Payer: Self-pay | Admitting: Medical-Surgical

## 2023-09-09 VITALS — BP 91/63 | HR 89 | Resp 20 | Ht 67.5 in | Wt 205.8 lb

## 2023-09-09 DIAGNOSIS — F419 Anxiety disorder, unspecified: Secondary | ICD-10-CM | POA: Diagnosis not present

## 2023-09-09 DIAGNOSIS — F4321 Adjustment disorder with depressed mood: Secondary | ICD-10-CM | POA: Diagnosis not present

## 2023-09-09 DIAGNOSIS — Z3009 Encounter for other general counseling and advice on contraception: Secondary | ICD-10-CM

## 2023-09-09 DIAGNOSIS — R7303 Prediabetes: Secondary | ICD-10-CM | POA: Diagnosis not present

## 2023-09-09 LAB — POCT GLYCOSYLATED HEMOGLOBIN (HGB A1C): Hemoglobin A1C: 4.9 % (ref 4.0–5.6)

## 2023-09-09 MED ORDER — ALPRAZOLAM 0.25 MG PO TABS: 0.2500 mg | ORAL_TABLET | Freq: Every day | ORAL | 0 refills | Status: DC | PRN

## 2023-09-09 MED ORDER — ESCITALOPRAM OXALATE 10 MG PO TABS: 10.0000 mg | ORAL_TABLET | Freq: Every day | ORAL | 3 refills | Status: DC

## 2023-09-09 NOTE — Addendum Note (Signed)
 Addended by: Bartolo Lights on: 09/09/2023 01:08 PM   Modules accepted: Orders

## 2023-09-09 NOTE — Progress Notes (Signed)
        Established patient visit  History, exam, impression, and plan:  1. Prediabetes (Primary) Pleasant 38 year old female presenting today with history of prediabetes.  Her last hemoglobin A1c was 5.4% 6 months ago.  She has been on Wegovy  for weight loss and is at the 2.4 mg weekly dose for maintenance.  Has been working on dietary modifications and regular intentional exercise.  Reports that she feels healthier than she has in her adult life.  Recheck of hemoglobin A1c today at 4.9%.    2. Morbid obesity (HCC) As noted above, she is on Wegovy  2.4 mg weekly and working on dietary and lifestyle modifications.  She has lost a total of 79 pounds since May 2022 and is doing well and maintaining.  Her BMI has dropped from 43.88-31.74.  She continues to work on weight loss as she is not at goal yet.  Continue Wegovy  2.4 mg weekly.  3. Anxiety 4. Grief She does have a history of anxiety with some depression.  She is on Lexapro  10 mg daily and has been taking this as prescribed, tolerating well without side effects.  Notes that she has multiple stressors including work as a production designer, theatre/television/film in airline pilot.  Her company is in the midst of a merger and there are jobs being eliminated.  She is struggling to manage on a daily basis and often times feels completely overwhelmed.  On top of this, she unexpectedly lost her 38 year old dog on Tuesday who was her very best friend.  She has been having a horrible time adjusting to the loss and spends a lot of time crying.  She was previously doing counseling weekly however they him increase this to twice weekly.  Denies SI/HI but does admit that she does not feel like she can handle anything else while her mental health status is so uncontrolled.  She has been out of work since 09/07/2023.  She has requested FMLA to cover her job but is not sure if this will be approved.  Brought forms with her today which we reviewed and filled out to allow 1 full week of continuous time out of work  from 09/06/2018 25-2/05/2024 with a plan to return on 09/13/2023.  We also allowed for intermittent episodes throughout the year should they be needed for anxiety concerns.  Paperwork was completed during our appointment and originals provided to patient.  We have made copies for our records and will upload this in the chart. - escitalopram  (LEXAPRO ) 10 MG tablet; Take 1 tablet (10 mg total) by mouth daily.  Dispense: 90 tablet; Refill: 3  5. Birth control counseling She is currently considering possible tubal ligation or other options for permanent sterilization.  History of intolerance to hormonal contraceptives due to weight gain which is a very big concern for her.  We did briefly review other options including tubal ligation, endometrial ablation, and hysterectomy.  Ultimately, she is still in the contemplation phase and will do some research.  Referring to OB/GYN so she can discuss options in detail with them and make an informed decision. - Ambulatory referral to Obstetrics / Gynecology   Procedures performed this visit: None.  Return in about 6 months (around 03/08/2024) for Weight and mood follow-up.  __________________________________ Zada FREDRIK Palin, DNP, APRN, FNP-BC Primary Care and Sports Medicine [facility]

## 2023-10-26 ENCOUNTER — Other Ambulatory Visit (HOSPITAL_COMMUNITY): Payer: Self-pay

## 2023-10-26 ENCOUNTER — Telehealth: Payer: Self-pay

## 2023-10-26 NOTE — Telephone Encounter (Signed)
 Pharmacy Patient Advocate Encounter   Received notification from Onbase that prior authorization for [facility] 2.4MG /0.75ML auto-injectors is required/requested.   Insurance verification completed.   The patient is insured through [facility] .   Per test claim: PA required; PA submitted to above mentioned insurance via CoverMyMeds Key/confirmation #/EOC [id number] Status is pending

## 2023-10-31 ENCOUNTER — Other Ambulatory Visit (HOSPITAL_COMMUNITY): Payer: Self-pay

## 2023-10-31 NOTE — Telephone Encounter (Signed)
 Pharmacy Patient Advocate Encounter  Received notification from [facility] that Prior Authorization for [facility] 2.4 has been APPROVED from 10/26/23 to 10/25/24. Unable to obtain price due to refill too soon rejection, last fill date 10/26/23 next available fill date5/28/25 pt has a max quantity of 9 per 63 days.   PA #/Case ID/Reference #: [id number]

## 2023-11-19 ENCOUNTER — Other Ambulatory Visit: Payer: Self-pay | Admitting: Medical-Surgical

## 2023-12-07 IMAGING — MR MR HIP*R* W/O CM
5 series · 34 of 40 positions shown · non-contrast
Comparison: None Available.

CLINICAL DATA: Persistent pain in the buttock near the piriformis.

EXAM:
MR OF THE RIGHT HIP WITHOUT CONTRAST
TECHNIQUE: Multiplanar, multisequence MR imaging was performed. No intravenous
contrast was administered.

[Series 3: T1 · coronal · 4.0mm · 1.19mm/px · 8 of 38 slices shown]
[im 1/38]
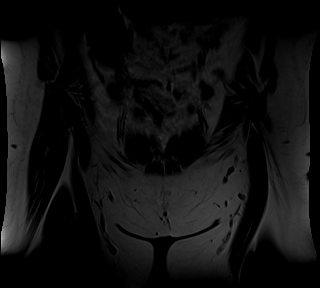
[im 5/38]
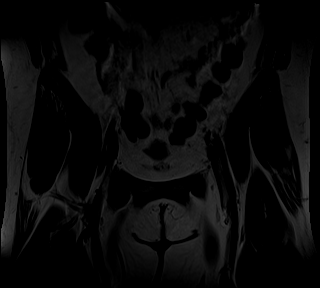
[im 13/38]
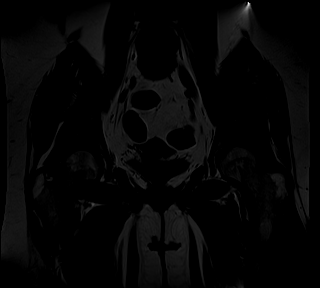
[im 17/38]
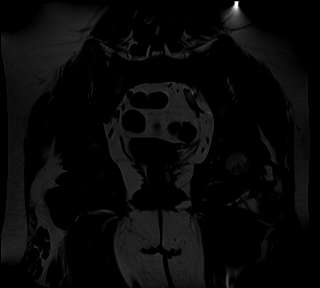
[im 21/38]
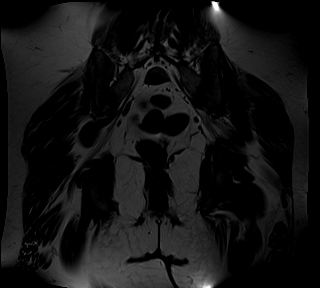
[im 25/38]
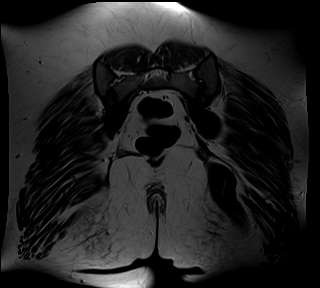
[im 33/38]
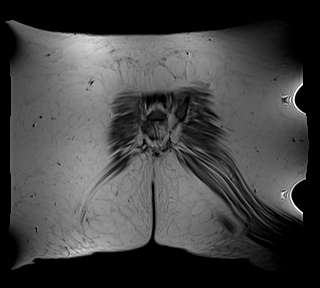
[im 38/38]
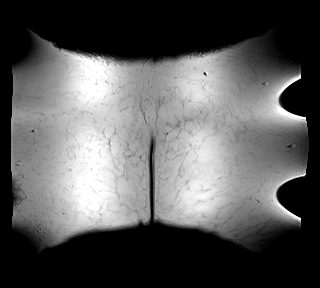

[Series 4: STIR · coronal · 4.0mm · 1.48mm/px · 8 of 36 slices shown (1 of 2)]
[im 1/36]
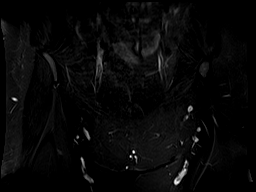
[im 4/36]
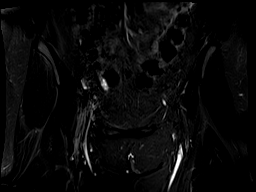
[im 12/36]
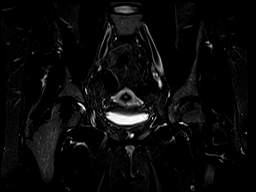
[im 16/36]
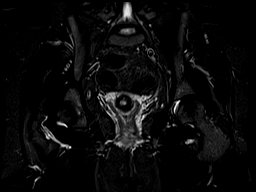
[im 20/36]
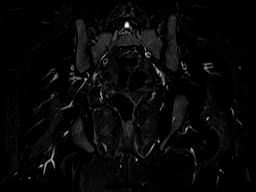
[im 24/36]
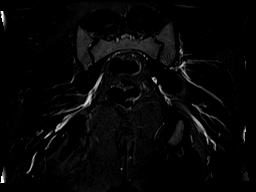
[im 32/36]
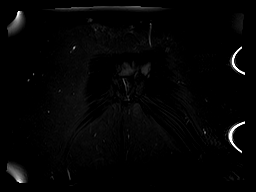
[im 36/36]
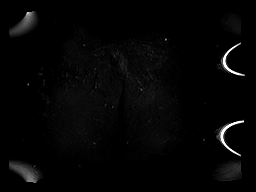

[Series 5: STIR · axial · 4.0mm · 1.41mm/px · z∈[-90,-0]mm · 5 of 28 slices shown (2 of 2)]
[im 1/28]
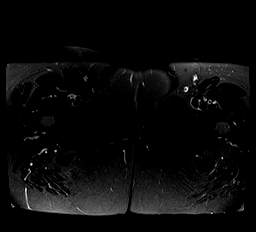
[im 5/28]
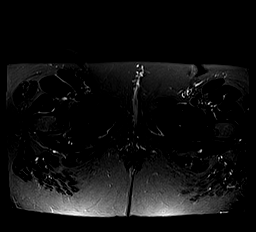
[im 10/28]
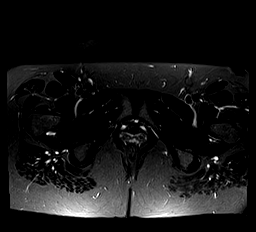
[im 14/28]
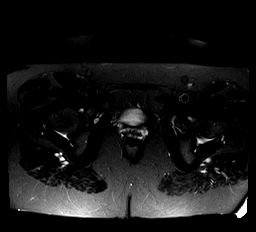
[im 19/28]
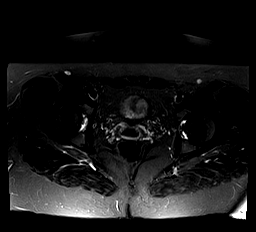

[Series 7: PD fat-sat · sagittal · 4.5mm · 0.35mm/px · 7 of 25 slices shown (1 of 2)]
[im 1/25]
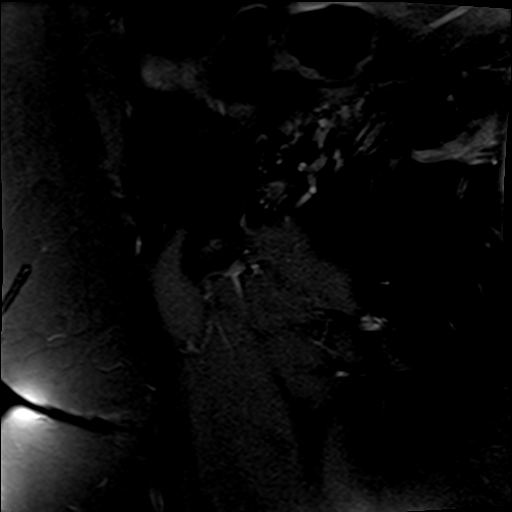
[im 5/25]
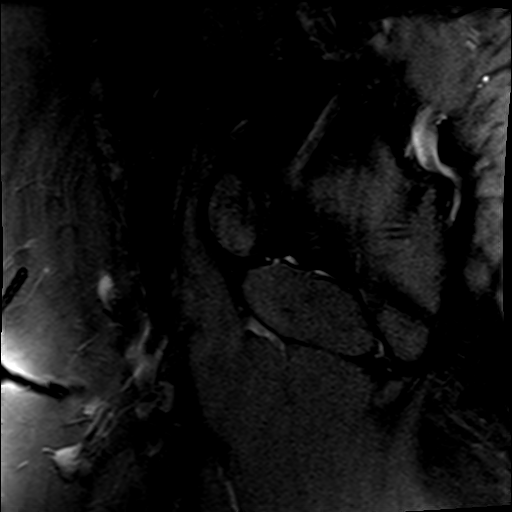
[im 9/25]
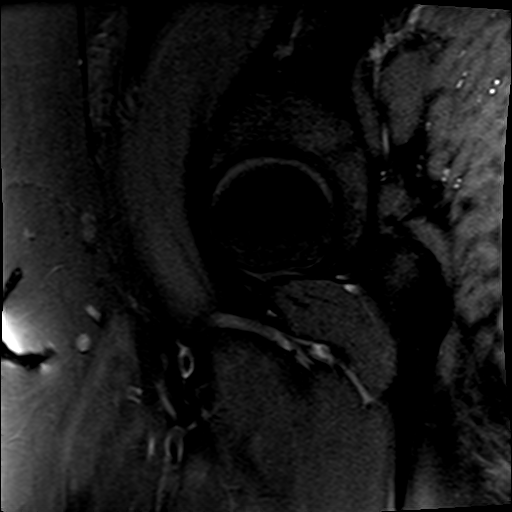
[im 13/25]
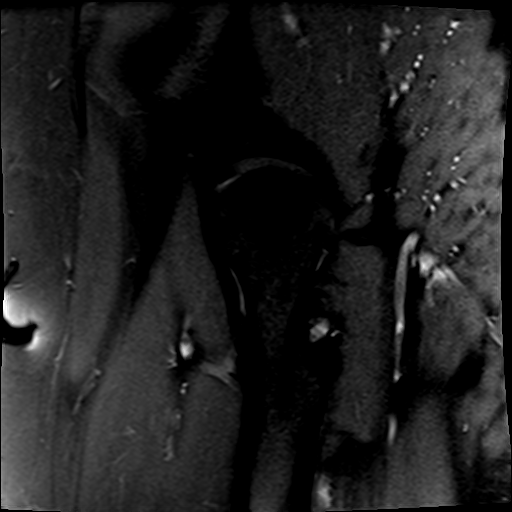
[im 17/25]
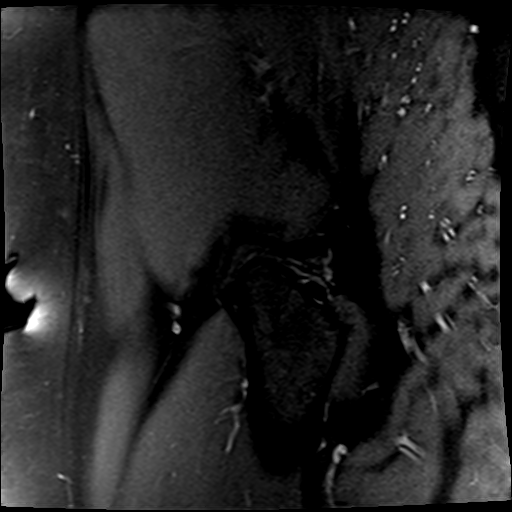
[im 21/25]
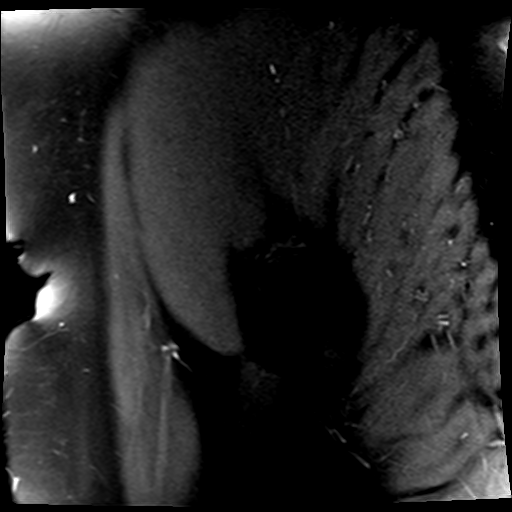
[im 25/25]
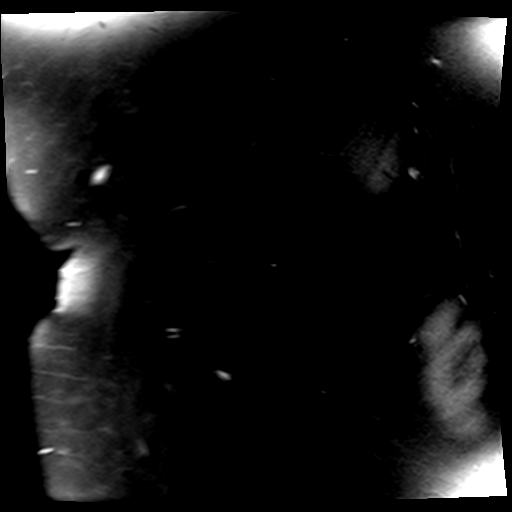

[Series 8: PD fat-sat · coronal · 4.5mm · 0.35mm/px · 6 of 23 slices shown (2 of 2)]
[im 1/23]
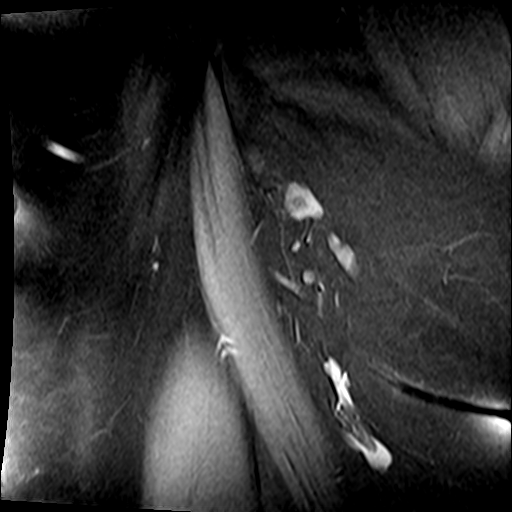
[im 5/23]
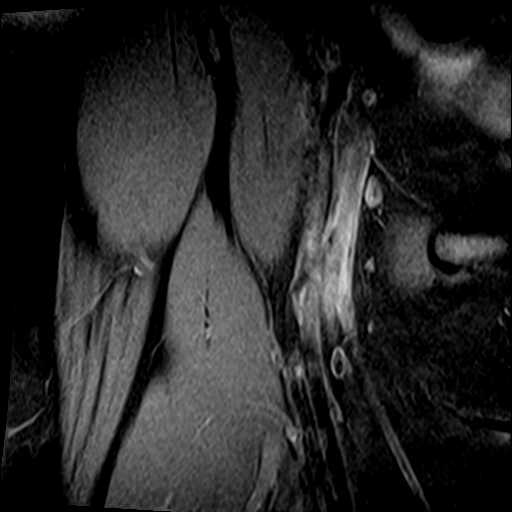
[im 9/23]
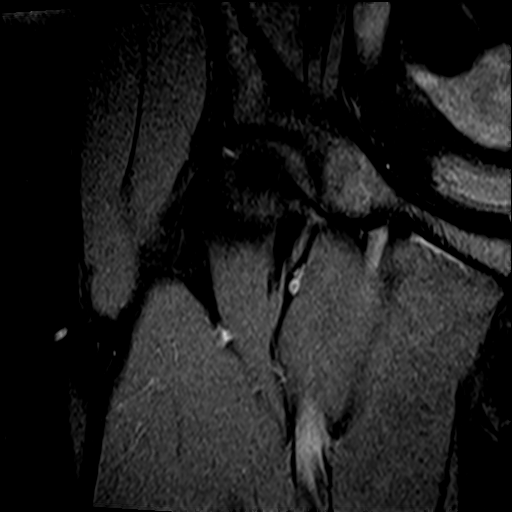
[im 14/23]
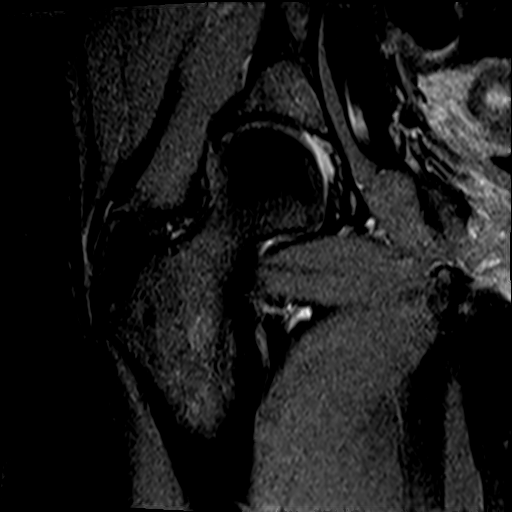
[im 18/23]
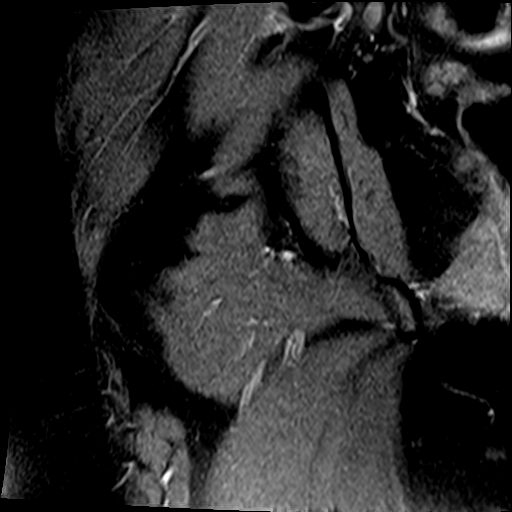
[im 23/23]
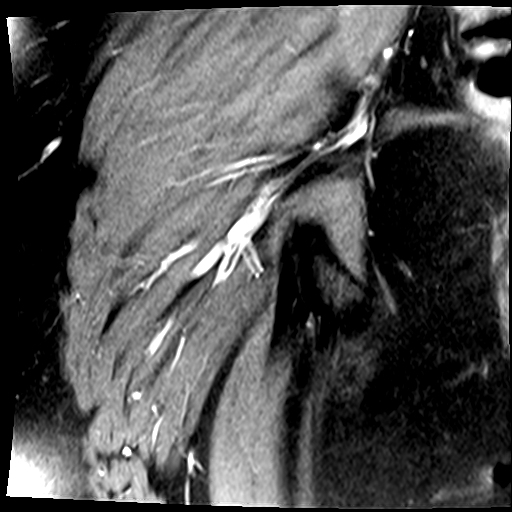

[34 of 40 positions shown; findings below may reference images not displayed]

FINDINGS: Bones:

No hip fracture, dislocation or avascular necrosis.

No periosteal reaction or bone destruction. No aggressive osseous
lesion.

Normal sacrum and sacroiliac joints. No SI joint widening or erosive
changes.

Visualized lumbar spine demonstrates no focal abnormality.

Articular cartilage and labrum

Articular cartilage:  No chondral defect.

Labrum: Grossly intact, but evaluation is limited by lack of
intraarticular fluid.

Joint or bursal effusion

Joint effusion:  No hip joint effusion.  No SI joint effusion.

Bursae:  No bursa formation.

Muscles and tendons

Flexors: Normal.

Extensors: Normal.

Abductors: Normal.

Adductors: Normal.

Gluteals: Normal.

Hamstrings: Normal.

Other findings

No pelvic free fluid. No fluid collection or hematoma. No inguinal
lymphadenopathy. No inguinal hernia.
IMPRESSION: 1. Normal MRI of the right hip.

## 2024-01-18 ENCOUNTER — Other Ambulatory Visit: Payer: Self-pay | Admitting: Medical-Surgical

## 2024-01-26 ENCOUNTER — Encounter: Admitting: Obstetrics and Gynecology

## 2024-02-07 ENCOUNTER — Other Ambulatory Visit: Payer: Self-pay | Admitting: Medical-Surgical

## 2024-02-16 ENCOUNTER — Encounter: Payer: Self-pay | Admitting: Obstetrics and Gynecology

## 2024-02-16 ENCOUNTER — Ambulatory Visit: Admitting: Obstetrics and Gynecology

## 2024-02-16 DIAGNOSIS — Z1379 Encounter for other screening for genetic and chromosomal anomalies: Secondary | ICD-10-CM

## 2024-02-16 DIAGNOSIS — Z1331 Encounter for screening for depression: Secondary | ICD-10-CM | POA: Diagnosis not present

## 2024-02-16 DIAGNOSIS — R232 Flushing: Secondary | ICD-10-CM | POA: Diagnosis not present

## 2024-02-16 MED ORDER — TRANEXAMIC ACID 650 MG PO TABS: 1300.0000 mg | ORAL_TABLET | Freq: Three times a day (TID) | ORAL | 6 refills | Status: AC

## 2024-02-16 NOTE — Progress Notes (Signed)
 GYNECOLOGY OFFICE VISIT NOTE  History:  Shawna Barnett is a 38 y.o. G1P0100 here today for consultation.   Discussed the use of AI scribe software for clinical note transcription with the patient, who gave verbal consent to proceed.  History of Present Illness Shawna Barnett is a 38 year old female who presents with concerns about perimenopause and hot flashes.  She has been experiencing hot flashes and is concerned about entering perimenopause. She has a history of irregular menstrual cycles, having not had periods for a decade until recently. She reports that after losing over 200 pounds in the last five years through lifestyle changes and the use of Wegovy , she began having menstrual cycles again after not having periods for a decade. Her periods remain irregular, and she has not had a period in a few months until starting one today. She is also on OCPs.   She has been on the birth control pill Junel  1/20 since her pregnancy over a decade ago and wants to discontinue it, citing potential weight loss benefits and concerns about hormonal effects. She is also on Lexapro , which she notes causes night sweats. She is concerned about the potential for heavy periods upon discontinuing birth control and is apprehensive about using IUDs or other hormonal contraceptives.  She has a family history of mental health issues, including schizophrenia, sociopathy, and clinical depression. 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 She has undergone genetic testing in the past but has not been tested for BRCA1 and BRCA2 mutations.  She is married but in the process of getting divorced. She has not visited a gynecologist in a decade, relying on her primary care provider for routine care. 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 She does not eat meat and is allergic to dairy.    Past Medical History:  Diagnosis Date   Anxiety    Asthma     Depression    Hyperlipidemia     Past Surgical History:  Procedure Laterality Date   BREAST REDUCTION SURGERY  2024   OPEN REPAIR PERIARTICULAR FRACTURE / DISLOCATION ELBOW     TONSILLECTOMY      The following portions of the patient's history were reviewed and updated as appropriate: allergies, current medications, past family history, past medical history, past social history, past surgical history and problem list.   Health Maintenance:   Diagnosis  Date Value Ref Range Status  01/27/2023   Final   - Negative for intraepithelial lesion or malignancy (NILM)    Review of Systems:  Pertinent items noted in HPI and remainder of comprehensive ROS otherwise negative.  Physical Exam:  BP 106/69   Pulse (!) 52   Ht 5' 7 (1.702 m)   Wt 212 lb (96.2 kg)   LMP 02/16/2024   BMI 33.20 kg/m  CONSTITUTIONAL: Well-developed, well-nourished female in no acute distress.  HEENT:  Normocephalic, atraumatic. External right and left ear normal. No scleral icterus.  NECK: Normal range of motion, supple, no masses noted on observation SKIN: No rash noted. Not diaphoretic. No erythema. No pallor. MUSCULOSKELETAL: Normal range of motion. No edema noted. NEUROLOGIC: Alert and oriented to person, place, and time. Normal muscle tone coordination. No cranial nerve deficit noted. PSYCHIATRIC: Normal mood and affect. Normal behavior. Normal judgment and thought content.  PELVIC: Deferred  Labs and Imaging No results found for this or any previous visit (from the past week). No results found.  Assessment  and Plan:  1. Ashkenazi Jewish ancestry requiring population-specific genetic screening Strong Ashkenazi Jewish background increases risk for BRCA mutations. Recommended genetic testing despite no family history due to higher prevalence in ethnic group - Artist (2+38)  2. Hot flashes Discontinue Junel  due to side effects and interest in non-hormonal options. Discussed potential for  acne and heavy bleeding post-discontinuation. Junel  and Lexapro  may contribute to symptoms. Hot flashes and irregular cycles may indicate perimenopause. Hormone levels to be reassessed post-Junel  discontinuation. Reviewed may need to recheck 3 months following depending on results.  - Discontinue Junel . - Order labs for menstrual cycle regularity after one month off Junel . - Consider non-hormonal contraceptive options if needed in the future. - Provide tranexamic acid  for potential heavy bleeding after discontinuation of Junel   - Estradiol - Follicle stimulating hormone - Luteinizing hormone - Progesterone - Prolactin - Testosterone,Free and Total - TSH Rfx on Abnormal to Free T4     Meds ordered this encounter  Medications   tranexamic acid  (LYSTEDA ) 650 MG TABS tablet    Sig: Take 2 tablets (1,300 mg total) by mouth 3 (three) times daily. Take during menses for a maximum of five days    Dispense:  30 tablet    Refill:  6     Routine preventative health maintenance measures emphasized. Please refer to After Visit Summary for other counseling recommendations.   Return in about 3 months (around 05/18/2024) for Follow up.  Vina Solian, MD, FACOG Obstetrician & Gynecologist, [facility] for [facility], [facility]

## 2024-02-29 ENCOUNTER — Ambulatory Visit: Admitting: Medical-Surgical

## 2024-02-29 ENCOUNTER — Encounter: Payer: Self-pay | Admitting: Medical-Surgical

## 2024-02-29 VITALS — BP 111/73 | HR 66 | Resp 20 | Ht 67.0 in | Wt 213.0 lb

## 2024-02-29 DIAGNOSIS — F4321 Adjustment disorder with depressed mood: Secondary | ICD-10-CM | POA: Diagnosis not present

## 2024-02-29 DIAGNOSIS — F419 Anxiety disorder, unspecified: Secondary | ICD-10-CM | POA: Diagnosis not present

## 2024-02-29 DIAGNOSIS — Z630 Problems in relationship with spouse or partner: Secondary | ICD-10-CM | POA: Diagnosis not present

## 2024-02-29 DIAGNOSIS — Z0289 Encounter for other administrative examinations: Secondary | ICD-10-CM | POA: Diagnosis not present

## 2024-02-29 NOTE — Progress Notes (Signed)
        Established patient visit   History of Present Illness   Discussed the use of AI scribe software for clinical note transcription with the patient, who gave verbal consent to proceed.  History of Present Illness   Shawna Barnett is a 38 year old female who presents for a follow-up regarding her mental health and FMLA paperwork.  She is experiencing significant life changes due to a separation/divorce after seven years of marriage, which has led to a range of emotions including grief, excitement, and sadness. She attends therapy twice a week to manage these feelings. She is on intermittent FMLA for anxiety and the grief process, allowing her to take one to two days off per month as needed. She can work but becomes overwhelmed and irritable when stressed.  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 She reports lifestyle changes, including weight loss, breast reduction, and tattoos. She is building muscle mass, and body scans have shown changes in her body composition.      Physical Exam   Physical Exam Vitals reviewed.  Constitutional:      General: She is not in acute distress.    Appearance: Normal appearance.  HENT:     Head: Normocephalic and atraumatic.  Cardiovascular:     Rate and Rhythm: Normal rate and regular rhythm.     Pulses: Normal pulses.     Heart sounds: Normal heart sounds. No murmur heard.    No friction rub. No gallop.  Pulmonary:     Effort: Pulmonary effort is normal. No respiratory distress.     Breath sounds: Normal breath sounds. No wheezing.  Skin:    General: Skin is warm and dry.  Neurological:     Mental Status: She is alert and oriented to person, place, and time.  Psychiatric:        Mood and Affect: Mood normal.        Behavior: Behavior normal.        Thought Content: Thought content normal.        Judgment: Judgment normal.     Assessment & Plan    Assessment and Plan    Anxiety disorder and grief reaction Anxiety and grief due to life changes, managed with therapy. FMLA updated for chronic conditions. - Continue therapy sessions twice a week. - Maintain FMLA through year-end for anxiety and grief. - Complete FMLA paperwork for intermittent leave 1-2 times/month, each episode lasting 1-2 days.      Follow up   Return if symptoms worsen or fail to improve.  __________________________________ Zada FREDRIK Palin, DNP, APRN, FNP-BC Primary Care and Sports Medicine [facility]

## 2024-03-28 ENCOUNTER — Encounter: Payer: Self-pay | Admitting: Medical-Surgical

## 2024-03-28 DIAGNOSIS — L74510 Primary focal hyperhidrosis, axilla: Secondary | ICD-10-CM

## 2024-04-03 ENCOUNTER — Encounter: Payer: Self-pay | Admitting: Sports Medicine

## 2024-04-03 ENCOUNTER — Encounter: Payer: Self-pay | Admitting: Obstetrics and Gynecology

## 2024-04-03 NOTE — Telephone Encounter (Signed)
 Pended referral

## 2024-04-07 ENCOUNTER — Other Ambulatory Visit: Payer: Self-pay | Admitting: Medical-Surgical

## 2024-04-12 ENCOUNTER — Other Ambulatory Visit: Payer: Self-pay | Admitting: Medical-Surgical

## 2024-04-17 ENCOUNTER — Encounter: Payer: Self-pay | Admitting: Obstetrics and Gynecology

## 2024-04-19 ENCOUNTER — Encounter: Payer: Self-pay | Admitting: Obstetrics and Gynecology

## 2024-04-19 ENCOUNTER — Telehealth: Admitting: Obstetrics and Gynecology

## 2024-04-19 DIAGNOSIS — Z3009 Encounter for other general counseling and advice on contraception: Secondary | ICD-10-CM

## 2024-04-19 MED ORDER — DIAZEPAM 5 MG PO TABS: 5.0000 mg | ORAL_TABLET | Freq: Once | ORAL | 0 refills | Status: AC

## 2024-04-19 NOTE — Progress Notes (Signed)
    GYNECOLOGY VIRTUAL VISIT ENCOUNTER NOTE  Provider location: Center for [facility] [place]   Patient location: Home  I connected with Shawna Barnett on 04/19/24 at  3:30 PM EDT by MyChart Video Encounter and verified that I am speaking with the correct person using two identifiers.   I discussed the limitations, risks, security and privacy concerns of performing an evaluation and management service virtually and the availability of in person appointments. I also discussed with the patient that there may be a patient responsible charge related to this service. The patient expressed understanding and agreed to proceed.   History:  Shawna Barnett is a 38 y.o. G74P0100 female being evaluated today for [facility] options.   Has never felt better off the birth control!   She has a new partner and thinks she would like something for birth control.   She reports history of heavy periods.  Has not yet tried TXA.     Past Medical History:  Diagnosis Date   Anxiety    Asthma    Depression    Hyperlipidemia    Past Surgical History:  Procedure Laterality Date   BREAST REDUCTION SURGERY  2024   OPEN REPAIR PERIARTICULAR FRACTURE / DISLOCATION ELBOW     TONSILLECTOMY     The following portions of the patient's history were reviewed and updated as appropriate: allergies, current medications, past family history, past medical history, past social history, past surgical history and problem list.   Health Maintenance:  Normal pap and negative HRHPV on 01/07/2023. Review of Systems:  Pertinent items noted in HPI and remainder of comprehensive ROS otherwise negative.  Physical Exam:   General:  Alert, oriented and cooperative. Patient appears to be in no acute distress.  Mental Status: Normal mood and affect. Normal behavior. Normal judgment and thought content.   Respiratory: Normal respiratory effort, no problems with respiration noted  Rest of physical exam  deferred due to type of encounter  Labs and Imaging No results found for this or any previous visit (from the past 2 weeks). No results found.     Assessment and Plan:     1. Birth control counseling (Primary) - Reviewed different types of birth control available: OCPs, vaginal ring, Nexplanon, Depo, various types of IUDs, permanent sterilization.  We reviewed the advantages and risks of each (particularly risk of uterine perforation with IUD).  We discussed side effects of each. - Patient has tried: OCPs - Patient desires: Paragard. - Discussed insertion process - she would like IM toradol, cervical block. She has a lot of anxiety about insertion. Can do preprocedure Valium . Rx sent to her pharmacy. Reviewed she will need ride to and from the procedure.        I discussed the assessment and treatment plan with the patient. The patient was provided an opportunity to ask questions and all were answered. The patient agreed with the plan and demonstrated an understanding of the instructions.   The patient was advised to call back or seek an in-person evaluation/go to the ED if the symptoms worsen or if the condition fails to improve as anticipated.  I provided 17 minutes of face-to-face time during this encounter. I also spent 5 minutes dedicated to the care of this patient including pre-visit review of records, post visit ordering of medications and appropriate tests or procedures, coordinating care and documenting this visit encounter.    Vina Solian, MD Center for [facility], [facility]

## 2024-05-03 ENCOUNTER — Encounter: Payer: Self-pay | Admitting: Obstetrics and Gynecology

## 2024-05-03 ENCOUNTER — Other Ambulatory Visit (HOSPITAL_COMMUNITY)
Admission: RE | Admit: 2024-05-03 | Discharge: 2024-05-03 | Disposition: A | Discharge status: Home / Self Care | Source: Ambulatory Visit | Attending: Obstetrics and Gynecology | Admitting: Obstetrics and Gynecology

## 2024-05-03 ENCOUNTER — Ambulatory Visit: Admitting: Obstetrics and Gynecology

## 2024-05-03 VITALS — BP 117/74 | HR 64 | Ht 67.0 in | Wt 208.0 lb

## 2024-05-03 DIAGNOSIS — Z113 Encounter for screening for infections with a predominantly sexual mode of transmission: Secondary | ICD-10-CM | POA: Diagnosis present

## 2024-05-03 DIAGNOSIS — Z3202 Encounter for pregnancy test, result negative: Secondary | ICD-10-CM | POA: Diagnosis not present

## 2024-05-03 DIAGNOSIS — Z30430 Encounter for insertion of intrauterine contraceptive device: Secondary | ICD-10-CM

## 2024-05-03 LAB — POCT URINE PREGNANCY: Preg Test, Ur: NEGATIVE

## 2024-05-03 MED ORDER — KETOROLAC TROMETHAMINE 60 MG/2ML IM SOLN
60.0000 mg | Freq: Once | INTRAMUSCULAR | Status: AC
  Administered 2024-05-03: 30 mg via INTRAMUSCULAR

## 2024-05-03 MED ORDER — PARAGARD INTRAUTERINE COPPER IU IUD
1.0000 | INTRAUTERINE_SYSTEM | Freq: Once | INTRAUTERINE | Status: AC
  Administered 2024-05-03: 1 via INTRAUTERINE

## 2024-05-03 MED ORDER — KETOROLAC TROMETHAMINE 30 MG/ML IJ SOLN: 30.0000 mg | Freq: Once | INTRAMUSCULAR | Status: DC

## 2024-05-03 NOTE — Patient Instructions (Signed)
 It is normal to have cramping and bleeding for the next few days to weeks. You should feel better every day.   Please call us  if you have any severe pain, bleeding that soaks more than 1 pad in a hour, have fevers, or feel like you're going to pass out.   You can take tylenol 1000mg  every 8 hours and ibuprofen 800mg  every 8 hours as needed for pain. It is ok to take both at the same time.   Please wait 6 hours before taking another dose of ibuprofen.

## 2024-05-03 NOTE — Addendum Note (Signed)
 Addended by: ORLINDA SILVANO ORN on: 05/03/2024 05:06 PM   Modules accepted: Orders

## 2024-05-03 NOTE — Progress Notes (Signed)
    GYNECOLOGY OFFICE PROCEDURE NOTE  Shawna Barnett is a 38 y.o. G1P0100 here for IUD insertion. No GYN concerns.  Last pap smear was on 01/27/23 NILM/HPV negative.  IUD Insertion Procedure Note Procedure: IUD insertion with Paragard UPT: Negative GC/CT testing: Offered and accepts  Patient identified.  Risks, benefits and alternatives of procedure were discussed including irregular bleeding, cramping, infection, malpositioning or misplacement of the IUD outside the uterus which may require further procedure such as laparoscopy. Also discussed >99% contraception efficacy, increased risk of ectopic pregnancy with failure of method.   Emphasized that this did not protect against STIs, condoms recommended during all sexual encounters. Consent signed. Time out performed.   Patient received preprocedural IM toradol 30mg  and valium  5mg . Speculum inserted and cervix visualized, prepped with 3 swabs of betadine. An intracervical block of lidocaine 1% 3cc at 12 o'clock was placed. The cervix was grasped with the tenaculum.  Uterus sounded to 7-8 cm.  and IUD then inserted without difficulty per manufacturer's instructions and strings cut to 3 cm below cervical os and all instruments removed. Pt tolerated well with moderate pain and minimal bleeding.   Discussed concerning signs/symptoms and to call if heavy bleeding, severe abdominal pain, or fever in the following 3 weeks. Manufacturer pamphlet/patient information given. Reviewed timing of efficacy for contraception and to use an alternative form of birth control until that time.  Kieth Carolin, MD Obstetrician & Gynecologist, [facility] for [employer], [facility]

## 2024-05-04 LAB — RPR+HBSAG+HCVAB+...
HIV Screen 4th Generation wRfx: NONREACTIVE
Hep C Virus Ab: NONREACTIVE
Hepatitis B Surface Ag: NEGATIVE
RPR Ser Ql: NONREACTIVE

## 2024-05-04 LAB — CERVICOVAGINAL ANCILLARY ONLY
Chlamydia: NEGATIVE
Comment: NEGATIVE
Comment: NEGATIVE
Comment: NORMAL
Neisseria Gonorrhea: NEGATIVE
Trichomonas: NEGATIVE

## 2024-05-07 ENCOUNTER — Ambulatory Visit: Payer: Self-pay | Admitting: Obstetrics and Gynecology

## 2024-05-23 ENCOUNTER — Encounter: Payer: Self-pay | Admitting: Obstetrics and Gynecology

## 2024-05-26 ENCOUNTER — Encounter: Payer: Self-pay | Admitting: Medical-Surgical

## 2024-05-26 ENCOUNTER — Other Ambulatory Visit: Payer: Self-pay | Admitting: Medical-Surgical

## 2024-05-28 MED ORDER — WEGOVY 2.4 MG/0.75ML ~~LOC~~ SOAJ: 2.4000 mg | SUBCUTANEOUS | 0 refills | Status: DC

## 2024-05-28 MED ORDER — ALPRAZOLAM 0.25 MG PO TABS: 0.2500 mg | ORAL_TABLET | Freq: Every day | ORAL | 0 refills | Status: DC | PRN

## 2024-05-29 ENCOUNTER — Telehealth: Admitting: Medical-Surgical

## 2024-05-29 ENCOUNTER — Encounter: Payer: Self-pay | Admitting: Medical-Surgical

## 2024-05-29 DIAGNOSIS — F419 Anxiety disorder, unspecified: Secondary | ICD-10-CM | POA: Diagnosis not present

## 2024-05-29 MED ORDER — GABAPENTIN 300 MG PO CAPS: 300.0000 mg | ORAL_CAPSULE | Freq: Every day | ORAL | 3 refills | Status: AC

## 2024-05-29 NOTE — Progress Notes (Signed)
 Virtual Visit via Video Note  I connected with Shawna Barnett on 05/29/24 at 11:10 AM EDT by a video enabled telemedicine application and verified that I am speaking with the correct person using two identifiers.   I discussed the limitations of evaluation and management by telemedicine and the availability of in person appointments. The patient expressed understanding and agreed to proceed.  Patient location: home Provider locations: office  Subjective:    Discussed the use of AI scribe software for clinical note transcription with the patient, who gave verbal consent to proceed.  History of Present Illness   Shawna Barnett is a 38 year old female with anxiety and sleep disturbances who presents with increased anxiety and sleep issues.  Anxiety and panic symptoms - Increased anxiety and stress related to ongoing divorce and recent work-related layoffs - Panic attacks occur, relieved by Xanax  0.25 mg - Financial stress from divorce exacerbates anxiety - Currently taking Lexapro  10mg  daily for anxiety management - Medication adjustments discussed with therapist  Sleep disturbances - Difficulty with sleep regulation - Uses gabapentin  300 mg at night for sleep, effective - On one occasion, gabapentin  failed to induce sleep, requiring Xanax  for sleep after a panic attack  Weight management - Currently on Wegovy  2.4mg  weekly for weight management - Significant weight loss achieved but not at goal - Exercises six days per week for one hour per session  Immunization status - Received influenza vaccine one month ago - Currently in the middle of HPV vaccine series     Past medical history, Surgical history, Family history not pertinant except as noted below, Social history, Allergies, and medications have been entered into the medical record, reviewed, and corrections made.   Review of Systems: See HPI for pertinent positives and negatives.   Objective:    General:  Speaking clearly in complete sentences without any shortness of breath.  Alert and oriented x3.  Normal judgment. No apparent acute distress.  Impression and Recommendations:    Assessment and Plan    Anxiety disorder with insomnia Chronic anxiety exacerbated by divorce and work/financial stress, causing panic attacks and sleep disturbances. Current medications include Xanax , gabapentin , and Lexapro . Attends therapy twice weekly. - Refilled gabapentin  300 mg, instructed to take 300-600 mg nightly as needed for sleep and anxiety. - Continue Xanax  0.25mg  as needed for panic attacks, sparing use recommended. - Continue Lexapro  for anxiety management. - Continue therapy sessions twice a week.  Obesity Obesity with significant weight loss through Wegovy  and exercise. Highest dose of Wegovy , aims to lose 30-50 more pounds. Exercises six days a week. Family history of diabetes; Wegovy  may help prevent diabetes. Current stressors suggest maintaining regimen. - Continue regular exercise regimen. - Continue Wegovy  2.4mg  weekly. - Continue dietary modifications. - If progress stalls, consider adjunct medications short term.  General Health Maintenance Received flu vaccine one month ago. Completing HPV vaccine series. Due for pneumonia vaccine due to asthma and other risk factors. - Ensure completion of the HPV vaccine series. - Receive pneumonia vaccine at next visit to [facility].     I discussed the assessment and treatment plan with the patient. The patient was provided an opportunity to ask questions and all were answered. The patient agreed with the plan and demonstrated an understanding of the instructions.   The patient was advised to call back or seek an in-person evaluation if the symptoms worsen or if the condition fails to improve as anticipated.  Return in about 6 months (around 11/27/2024) for  mood follow up (in office please).  Zada FREDRIK Palin, DNP, APRN, FNP-BC [facility]  [facility]

## 2024-06-06 ENCOUNTER — Ambulatory Visit: Payer: Self-pay | Admitting: Obstetrics and Gynecology

## 2024-06-06 LAB — PROLACTIN: Prolactin: 16 ng/mL (ref 4.8–33.4)

## 2024-06-06 LAB — FOLLICLE STIMULATING HORMONE: FSH: 5.1 m[IU]/mL

## 2024-06-06 LAB — TSH RFX ON ABNORMAL TO FREE T4: TSH: 0.329 u[IU]/mL — ABNORMAL LOW (ref 0.450–4.500)

## 2024-06-06 LAB — LUTEINIZING HORMONE: LH: 6.9 m[IU]/mL

## 2024-06-06 LAB — ESTRADIOL: Estradiol: 126 pg/mL

## 2024-06-06 LAB — TESTOSTERONE,FREE AND TOTAL
Testosterone, Free: 0.7 pg/mL (ref 0.0–4.2)
Testosterone: 24 ng/dL (ref 8–60)

## 2024-06-06 LAB — PROGESTERONE: Progesterone: 0.1 ng/mL

## 2024-06-06 LAB — T4F: T4,Free (Direct): 0.95 ng/dL (ref 0.82–1.77)

## 2024-06-12 ENCOUNTER — Ambulatory Visit

## 2024-06-12 ENCOUNTER — Encounter: Payer: Self-pay | Admitting: Obstetrics and Gynecology

## 2024-06-12 ENCOUNTER — Telehealth: Payer: Self-pay | Admitting: Obstetrics & Gynecology

## 2024-06-12 ENCOUNTER — Other Ambulatory Visit: Payer: Self-pay

## 2024-06-12 DIAGNOSIS — R102 Pelvic and perineal pain unspecified side: Secondary | ICD-10-CM | POA: Diagnosis not present

## 2024-06-12 DIAGNOSIS — N939 Abnormal uterine and vaginal bleeding, unspecified: Secondary | ICD-10-CM

## 2024-06-12 NOTE — Telephone Encounter (Signed)
-   Patient had called into nurse line regarding concerns regarding read on pelvic ultrasound - I independently reviewed pelvic ultrasound, IUD appears in proper location-while one of the arms appears slightly within the myometrium, I do not think removal is indicated -If she is not having significant pelvic pain may reconsider - Discussed bilateral small hemorrhagic ovarian cysts.  Recommended ibuprofen and heating pack no further imaging indicated unless clinically indicated - Okay to travel as scheduled -Follow-up as scheduled for IUD check - Questions and concerns were addressed and patient will agreeable to above

## 2024-06-13 ENCOUNTER — Ambulatory Visit: Payer: Self-pay | Admitting: Obstetrics and Gynecology

## 2024-07-01 ENCOUNTER — Other Ambulatory Visit: Payer: Self-pay | Admitting: Medical-Surgical

## 2024-07-02 NOTE — Telephone Encounter (Signed)
 Last filled 05/28/2024  Last virtual visit 05/29/2024

## 2024-07-17 ENCOUNTER — Encounter: Payer: Self-pay | Admitting: Podiatry

## 2024-07-17 ENCOUNTER — Ambulatory Visit: Payer: Self-pay | Admitting: Podiatry

## 2024-07-17 ENCOUNTER — Encounter: Payer: Self-pay | Admitting: Medical-Surgical

## 2024-07-17 DIAGNOSIS — B070 Plantar wart: Secondary | ICD-10-CM

## 2024-07-17 NOTE — Progress Notes (Signed)
°  Subjective:  Patient ID: Shawna Barnett, female    DOB: 1986-01-28,   MRN: [id number]  Chief Complaint  Patient presents with   Plantar Warts    I think they're warts.  They're all over.  They're on both feet and they're embarrassing.    38 y.o. female presents for concern of lesions on the bottom of the left foot that have been present for some time.  She relates they are all over the bottom of her foot.  She relates not too much pain with them but she is ready to get rid of them.  She has tried some over-the-counter treatments but was not able to keep up with the treatments.. Denies any other pedal complaints. Denies n/v/f/c.   Past Medical History:  Diagnosis Date   Anxiety    Asthma    Depression    Hyperlipidemia     Objective:  Physical Exam: Vascular: DP/PT pulses 2/4 bilateral. CFT <3 seconds. Normal hair growth on digits. No edema.  Skin. No lacerations or abrasions bilateral feet.  Multiple lesions noted to plantar left foot x 5.  Lesion also noted to medial fifth digit.Multiple capillary budding is noted throughout with cauliflower-like appearance and loss of skin tension lines within the lesion itself. Musculoskeletal: MMT 5/5 bilateral lower extremities in DF, PF, Inversion and Eversion. Deceased ROM in DF of ankle joint.  Neurological: Sensation intact to light touch.   Assessment:   1. Plantar wart of left foot      Plan:  Patient was evaluated and treated and all questions answered. -Discussed warts and their etiology with patient and treatment options.  -Hyperkeratotic tissue was debrided with chisel without incident.  -Recommend use of OTC compound W.  -Application of cantrhone provided today. Advised patient to remove bandaging tomorrow.  -Discussed future options such as laser treatment if unsuccessful.  -Advised good supportive shoes and inserts -Patient to return to office in 3 weeks or sooner if condition worsens.   [place]berry Failing, DPM

## 2024-07-20 ENCOUNTER — Ambulatory Visit: Payer: Self-pay | Admitting: Medical-Surgical

## 2024-07-20 LAB — TSH+T4F+T3FREE+THYABS+TPO+VD25
Free T4: 0.92 ng/dL (ref 0.82–1.77)
T3, Free: 3 pg/mL (ref 2.0–4.4)
TSH: 0.237 u[IU]/mL — ABNORMAL LOW (ref 0.450–4.500)
Thyroglobulin Antibody: <1 [IU]/mL (ref 0.0–0.9)
Thyroperoxidase Ab SerPl-aCnc: 12 [IU]/mL (ref 0–34)
Vit D, 25-Hydroxy: 63.1 ng/mL (ref 30.0–100.0)

## 2024-07-29 ENCOUNTER — Other Ambulatory Visit: Payer: Self-pay | Admitting: Medical-Surgical

## 2024-08-08 ENCOUNTER — Ambulatory Visit: Admitting: Podiatry

## 2024-08-09 ENCOUNTER — Other Ambulatory Visit: Payer: Self-pay | Admitting: Medical-Surgical

## 2024-08-29 ENCOUNTER — Other Ambulatory Visit: Payer: Self-pay | Admitting: Medical-Surgical

## 2024-08-29 NOTE — Telephone Encounter (Signed)
 Last filled 09/29/2020  Last Telemedicine visit 05/29/2024  Return in about 6 months (around 11/27/2024) for mood follow up (in office please).    No follow up appointment scheduled.

## 2024-08-29 NOTE — Telephone Encounter (Signed)
 Patient will schedule appointment through her MyChart.

## 2024-08-29 NOTE — Telephone Encounter (Signed)
 Never filled by anyone here. Needs appointment to discuss.

## 2024-09-01 ENCOUNTER — Other Ambulatory Visit: Payer: Self-pay | Admitting: Medical-Surgical

## 2024-09-01 DIAGNOSIS — F419 Anxiety disorder, unspecified: Secondary | ICD-10-CM

## 2024-09-04 ENCOUNTER — Other Ambulatory Visit: Payer: Self-pay | Admitting: Medical-Surgical

## 2024-09-04 NOTE — Telephone Encounter (Signed)
 Needs Alternative. Drug not covered.  Last filled 07/30/2024  Last OV 05/29/2024

## 2024-09-06 NOTE — Telephone Encounter (Signed)
 Will update pt insurance info from media encounter 08/29/2024. Furthermore pt has made a virtual appointment for tomorrow for further discussion.

## 2024-09-07 ENCOUNTER — Encounter: Payer: Self-pay | Admitting: Medical-Surgical

## 2024-09-07 ENCOUNTER — Telehealth: Admitting: Medical-Surgical

## 2024-09-07 DIAGNOSIS — Z862 Personal history of diseases of the blood and blood-forming organs and certain disorders involving the immune mechanism: Secondary | ICD-10-CM

## 2024-09-07 DIAGNOSIS — E782 Mixed hyperlipidemia: Secondary | ICD-10-CM

## 2024-09-07 DIAGNOSIS — J309 Allergic rhinitis, unspecified: Secondary | ICD-10-CM

## 2024-09-07 DIAGNOSIS — R7303 Prediabetes: Secondary | ICD-10-CM

## 2024-09-07 DIAGNOSIS — K219 Gastro-esophageal reflux disease without esophagitis: Secondary | ICD-10-CM

## 2024-09-07 DIAGNOSIS — J4530 Mild persistent asthma, uncomplicated: Secondary | ICD-10-CM

## 2024-09-07 DIAGNOSIS — F419 Anxiety disorder, unspecified: Secondary | ICD-10-CM

## 2024-09-07 DIAGNOSIS — R7989 Other specified abnormal findings of blood chemistry: Secondary | ICD-10-CM

## 2024-09-07 MED ORDER — ZEPBOUND 10 MG/0.5ML ~~LOC~~ SOAJ: 10.0000 mg | SUBCUTANEOUS | 0 refills | Status: AC

## 2024-09-07 MED ORDER — MONTELUKAST SODIUM 10 MG PO TABS: 10.0000 mg | ORAL_TABLET | Freq: Every day | ORAL | 3 refills | Status: AC

## 2024-09-07 MED ORDER — OMEPRAZOLE 20 MG PO CPDR: 20.0000 mg | DELAYED_RELEASE_CAPSULE | Freq: Every day | ORAL | 3 refills | Status: AC

## 2024-09-07 MED ORDER — ESCITALOPRAM OXALATE 10 MG PO TABS: 10.0000 mg | ORAL_TABLET | Freq: Every day | ORAL | 3 refills | Status: AC

## 2024-09-07 NOTE — Progress Notes (Signed)
 Virtual Visit via Video Note  I connected with Shawna Barnett on 09/07/24 at  9:10 AM EST by a video enabled telemedicine application and verified that I am speaking with the correct person using two identifiers.   I discussed the limitations of evaluation and management by telemedicine and the availability of in person appointments. The patient expressed understanding and agreed to proceed.  Patient location: home Provider locations: office  Subjective:    Discussed the use of AI scribe software for clinical note transcription with the patient, who gave verbal consent to proceed.  History of Present Illness   Shawna Barnett is a 39 year old female who presents for medication management and insurance update.  Weight management and diabetes prevention - Uses Wegovy  2.4mg  weekly for weight management and diabetes prevention due to strong family history of diabetes - Current weight is 182 lb with a goal of 145 lb - Last known A1c was 4.9% in February of last year; previously borderline to transitioning to diabetes before starting GLP-1 therapy - Concerned about developing diabetes due to family history and weight  Thyroid nodules - Has thyroid nodules, including one highly suspicious for malignancy - Ongoing thyroid function monitoring - ENT aware of GLP-1 therapy and did not recommend discontinuation  Mood and anxiety - Takes Lexapro  10mg  daily for mood - Marked mood changes when missing Lexapro  doses - Increased Xanax  use in the setting of recent divorce and job change  Gastroesophageal reflux disease - Reflux controlled with daily over-the-counter omeprazole  - Would like to have this sent as a prescription for cost saving  Allergic rhinitis and asthma - Takes Singulair  10 mg for allergies and asthma with good control - Uses Breztri daily with albuterol  as needed  Hip pain - Uses gabapentin  300-600mg  nightly as needed for hip pain and stress relief,  effective  Iron deficiency - History of iron deficiency - Iron levels have not been checked recently     Past medical history, Surgical history, Family history not pertinant except as noted below, Social history, Allergies, and medications have been entered into the medical record, reviewed, and corrections made.   Review of Systems: See HPI for pertinent positives and negatives.   Objective:    General: Speaking clearly in complete sentences without any shortness of breath.  Alert and oriented x3.  Normal judgment. No apparent acute distress.  Impression and Recommendations:    Assessment and Plan    Morbid obesity Morbid obesity with prediabetes, hyperlipidemia, and metabolic syndrome. Considering switch to Zepbound  due to insurance issues. Zepbound  preferred for dual action on GLP-1 and GIP. Concerned about medication costs and potential diabetes. Patient will not be on another GLP-1 while taking Zepbound .  - Initial weight prior to GLP-1 therapy at 270lbs, weight today 182lbs showing 32.59% body weight loss since initiation of treatment.  - Counseled on risks of pancreatitis and constipation with Zepbound . - Switching to Zepbound  10mg  weekly. - Discussed financial options for GLP-1 coverage if not covered by insurance. - Continue weight management plan with goal of 145 lbs. - Continue regular intentional exercise and dietary modifications. - Follow up in 4 weeks after starting Zepbound  to assess tolerance and response.  Prediabetes Family history of diabetes. Previous A1c was 4.9% likely due to medication. Believes weight loss and medication prevent progression. - Ordered A1c test. - Continue current medication regimen.  Thyroid nodule, suspicious for malignancy Awaiting biopsy results. Aware of potential need for thyroidectomy if malignancy confirmed. - Schedule and complete thyroid  biopsy. - Discuss potential thyroidectomy if biopsy confirms malignancy. - Per ENT, not  concerned about GLP-1 use.  Mild persistent asthma Managed with daily Singulair . - Continue Singulair  10 mg daily. - Continue Breztri and albuterol  as prescribed.   Allergic rhinitis Managed with Singulair , effective. - Continue Singulair  as prescribed.  Gastroesophageal reflux disease Managed with over-the-counter omeprazole . Requests prescription to reduce cost. - Prescribed omeprazole  20mg  for daily use.  Anxiety disorder Managed with Lexapro . Reports increased anxiety and irritability when doses missed. Prefers current regimen due to situational stressors. - Refilled Lexapro  10mg  daily. - Continue current Lexapro  regimen but if desired, can consider switch to Pristiq or a different agent.  History of iron deficiency anemia Iron levels need re-evaluation. - Ordered blood work to recheck iron levels.  General health maintenance Routine health maintenance discussed. - Ordered comprehensive blood work including CBC, CMP, and lipids. - Schedule blood work at [facility].     I discussed the assessment and treatment plan with the patient. The patient was provided an opportunity to ask questions and all were answered. The patient agreed with the plan and demonstrated an understanding of the instructions.   The patient was advised to call back or seek an in-person evaluation if the symptoms worsen or if the condition fails to improve as anticipated.  Return in about 4 weeks (around 10/05/2024) for Zepbound  follow up if able to obtain.  Zada FREDRIK Palin, DNP, APRN, FNP-BC [facility]

## 2024-10-23 ENCOUNTER — Ambulatory Visit: Admitting: Podiatry
